# Patient Record
Sex: Female | Born: 1975 | Race: Black or African American | Hispanic: No | Smoking: Current every day smoker
Health system: Southern US, Community
[De-identification: ages and names within clinical notes are randomized; demographics above are authoritative.]

## PROBLEM LIST (undated history)

## (undated) ENCOUNTER — Emergency Department (HOSPITAL_COMMUNITY): Admission: EM | Payer: Non-veteran care | Source: Home / Self Care

## (undated) DIAGNOSIS — O99111 Other diseases of the blood and blood-forming organs and certain disorders involving the immune mechanism complicating pregnancy, first trimester: Secondary | ICD-10-CM

## (undated) DIAGNOSIS — D6862 Lupus anticoagulant syndrome: Secondary | ICD-10-CM

## (undated) HISTORY — PX: CHOLECYSTECTOMY: SHX55

## (undated) HISTORY — DX: Other diseases of the blood and blood-forming organs and certain disorders involving the immune mechanism complicating pregnancy, first trimester: D68.62

## (undated) HISTORY — DX: Other diseases of the blood and blood-forming organs and certain disorders involving the immune mechanism complicating pregnancy, first trimester: O99.111

---

## 2000-11-26 ENCOUNTER — Other Ambulatory Visit: Admission: RE | Admit: 2000-11-26 | Discharge: 2000-11-26 | Payer: Self-pay | Admitting: Internal Medicine

## 2004-10-13 ENCOUNTER — Inpatient Hospital Stay (HOSPITAL_COMMUNITY): Admission: AD | Admit: 2004-10-13 | Discharge: 2004-10-21 | Payer: Self-pay | Admitting: Obstetrics and Gynecology

## 2004-10-13 ENCOUNTER — Ambulatory Visit (HOSPITAL_COMMUNITY): Admission: RE | Admit: 2004-10-13 | Discharge: 2004-10-13 | Payer: Self-pay | Admitting: Obstetrics and Gynecology

## 2004-10-18 ENCOUNTER — Encounter (INDEPENDENT_AMBULATORY_CARE_PROVIDER_SITE_OTHER): Payer: Self-pay | Admitting: General Surgery

## 2005-01-04 ENCOUNTER — Other Ambulatory Visit: Admission: RE | Admit: 2005-01-04 | Discharge: 2005-01-04 | Payer: Self-pay | Admitting: Obstetrics and Gynecology

## 2005-02-18 ENCOUNTER — Inpatient Hospital Stay (HOSPITAL_COMMUNITY): Admission: AD | Admit: 2005-02-18 | Discharge: 2005-02-20 | Payer: Self-pay | Admitting: Obstetrics and Gynecology

## 2006-12-09 ENCOUNTER — Emergency Department (HOSPITAL_COMMUNITY): Admission: EM | Admit: 2006-12-09 | Discharge: 2006-12-09 | Payer: Self-pay | Admitting: Emergency Medicine

## 2008-01-08 ENCOUNTER — Emergency Department (HOSPITAL_COMMUNITY): Admission: EM | Admit: 2008-01-08 | Discharge: 2008-01-09 | Payer: Self-pay | Admitting: Emergency Medicine

## 2008-01-18 ENCOUNTER — Emergency Department (HOSPITAL_COMMUNITY): Admission: EM | Admit: 2008-01-18 | Discharge: 2008-01-18 | Payer: Self-pay | Admitting: Emergency Medicine

## 2008-01-26 ENCOUNTER — Emergency Department (HOSPITAL_COMMUNITY): Admission: EM | Admit: 2008-01-26 | Discharge: 2008-01-26 | Payer: Self-pay | Admitting: Emergency Medicine

## 2008-01-26 ENCOUNTER — Encounter: Payer: Self-pay | Admitting: Orthopedic Surgery

## 2008-01-28 ENCOUNTER — Ambulatory Visit: Payer: Self-pay | Admitting: Orthopedic Surgery

## 2008-01-28 DIAGNOSIS — S93409A Sprain of unspecified ligament of unspecified ankle, initial encounter: Secondary | ICD-10-CM | POA: Insufficient documentation

## 2008-02-11 ENCOUNTER — Ambulatory Visit: Payer: Self-pay | Admitting: Orthopedic Surgery

## 2008-11-24 ENCOUNTER — Emergency Department (HOSPITAL_COMMUNITY): Admission: EM | Admit: 2008-11-24 | Discharge: 2008-11-24 | Payer: Self-pay | Admitting: Emergency Medicine

## 2009-03-23 ENCOUNTER — Emergency Department (HOSPITAL_COMMUNITY): Admission: EM | Admit: 2009-03-23 | Discharge: 2009-03-23 | Payer: Self-pay | Admitting: Emergency Medicine

## 2009-10-03 ENCOUNTER — Emergency Department (HOSPITAL_COMMUNITY): Admission: EM | Admit: 2009-10-03 | Discharge: 2009-10-03 | Payer: Self-pay | Admitting: Emergency Medicine

## 2009-11-30 ENCOUNTER — Emergency Department (HOSPITAL_COMMUNITY): Admission: EM | Admit: 2009-11-30 | Discharge: 2009-11-30 | Payer: Self-pay | Admitting: Emergency Medicine

## 2010-05-21 LAB — DIFFERENTIAL
Lymphocytes Relative: 46 % (ref 12–46)
Lymphs Abs: 1.4 10*3/uL (ref 0.7–4.0)
Monocytes Absolute: 0.3 10*3/uL (ref 0.1–1.0)
Monocytes Relative: 11 % (ref 3–12)
Neutro Abs: 1.2 10*3/uL — ABNORMAL LOW (ref 1.7–7.7)
Neutrophils Relative %: 40 % — ABNORMAL LOW (ref 43–77)

## 2010-05-21 LAB — URINALYSIS, ROUTINE W REFLEX MICROSCOPIC
Bilirubin Urine: NEGATIVE
Nitrite: NEGATIVE
Specific Gravity, Urine: 1.015 (ref 1.005–1.030)
Urobilinogen, UA: 0.2 mg/dL (ref 0.0–1.0)

## 2010-05-21 LAB — COMPREHENSIVE METABOLIC PANEL
ALT: 20 U/L (ref 0–35)
Albumin: 4.3 g/dL (ref 3.5–5.2)
CO2: 31 mEq/L (ref 19–32)
Chloride: 102 mEq/L (ref 96–112)
Creatinine, Ser: 0.85 mg/dL (ref 0.4–1.2)
GFR calc non Af Amer: >60 mL/min (ref >60)
Glucose, Bld: 89 mg/dL (ref 70–99)
Potassium: 4.4 mEq/L (ref 3.5–5.1)
Sodium: 141 mEq/L (ref 135–145)

## 2010-05-21 LAB — CBC
HCT: 39.3 % (ref 36.0–46.0)
MCHC: 33.8 g/dL (ref 30.0–36.0)
MCV: 93.5 fL (ref 78.0–100.0)
RDW: 13.2 % (ref 11.5–15.5)

## 2010-05-21 LAB — URINE MICROSCOPIC-ADD ON

## 2010-06-09 LAB — URINALYSIS, ROUTINE W REFLEX MICROSCOPIC
Bilirubin Urine: NEGATIVE
Leukocytes, UA: NEGATIVE
Nitrite: NEGATIVE
Protein, ur: NEGATIVE mg/dL

## 2010-06-09 LAB — COMPREHENSIVE METABOLIC PANEL
ALT: 11 U/L (ref 0–35)
AST: 18 U/L (ref 0–37)
Albumin: 4.2 g/dL (ref 3.5–5.2)
Alkaline Phosphatase: 46 U/L (ref 39–117)
Calcium: 9.1 mg/dL (ref 8.4–10.5)
GFR calc Af Amer: >60 mL/min (ref >60)
Glucose, Bld: 95 mg/dL (ref 70–99)
Sodium: 139 mEq/L (ref 135–145)
Total Bilirubin: 0.6 mg/dL (ref 0.3–1.2)
Total Protein: 7.4 g/dL (ref 6.0–8.3)

## 2010-06-09 LAB — CBC
MCHC: 34.8 g/dL (ref 30.0–36.0)
MCV: 94.2 fL (ref 78.0–100.0)
Platelets: 229 10*3/uL (ref 150–400)
RBC: 3.94 MIL/uL (ref 3.87–5.11)
WBC: 8.8 10*3/uL (ref 4.0–10.5)

## 2010-06-09 LAB — DIFFERENTIAL
Basophils Absolute: 0 10*3/uL (ref 0.0–0.1)
Basophils Relative: 0 % (ref 0–1)

## 2010-06-09 LAB — PREGNANCY, URINE: Preg Test, Ur: NEGATIVE

## 2010-06-09 LAB — WET PREP, GENITAL
Clue Cells Wet Prep HPF POC: NONE SEEN
Trich, Wet Prep: NONE SEEN
Yeast Wet Prep HPF POC: NONE SEEN

## 2010-06-09 LAB — URINE MICROSCOPIC-ADD ON

## 2010-07-21 NOTE — H&P (Signed)
Caroline Bishop, Caroline Bishop           ACCOUNT NO.:  [id number]   MEDICAL RECORD NO.:  [id number]          PATIENT TYPE:  INP   LOCATION:  9146                          FACILITY:  [facility]   PHYSICIAN:  Naima A. Dillard, M.D. DATE OF BIRTH:  04-14-1975   DATE OF ADMISSION:  02/18/2005  DATE OF DISCHARGE:                                HISTORY & PHYSICAL   CONTINUATION   REVIEW OF SYSTEMS:  Is as described above. The patient is typical of one  with a uterine pregnancy at term in early labor.   PHYSICAL EXAM:  VITAL SIGNS:  Stable. The patient is afebrile.  HEENT is unremarkable.  HEART:  Regular rate and rhythm.  LUNGS:  Clear.  ABDOMEN:  Gravid in its contour. Uterine fundus is noted to extend 39 cm  above the level of the pubic symphysis. Leopold's maneuvers finds the infant  to be in a longitudinal lie, cephalic presentation and the estimated fetal  weight is 7-1/2 pounds. The baseline of the fetal heart rate monitor is 140  with average long-term variability. Reactivity is present with no periodic  changes. On admission the patient was contracting irregularly. Following  morphine rest she began contracting every 2-3 minutes.  PELVIC:  Her cervix was 3 cm dilated, 80% effaced, vertex came down to -3  station. On admission Dr. Normand Sloop artificially ruptured membranes with clear  fluid and inserted FSE and IUPC.  EXTREMITIES:  Show no pathologic edema. DTRs were 1+ with no clonus.   ASSESSMENT:  Intrauterine pregnancy at term, early labor.   PLAN:  Admit per Dr. Normand Sloop. Routine M.D. orders. See orders as follows.      Rica Koyanagi, C.N.M.      Naima A. Normand Sloop, M.D.  Electronically Signed    SDM/MEDQ  D:  02/18/2005  T:  02/19/2005  Job:  [id number]
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
## 2010-07-21 NOTE — Op Note (Signed)
NAME:  Caroline Bishop, Caroline Bishop NO.:  [id number]   MEDICAL RECORD NO.:  [id number]          PATIENT TYPE:  INP   LOCATION:  A427                          FACILITY:  [facility]   PHYSICIAN:  Dalia Heading, M.D.  DATE OF BIRTH:  March 04, 1976   DATE OF PROCEDURE:  10/18/2004  DATE OF DISCHARGE:                                 OPERATIVE REPORT   PREOPERATIVE DIAGNOSIS:  Biliary colic, cholecystitis, cholelithiasis,  intrauterine pregnancy.   POSTOPERATIVE DIAGNOSIS:  Biliary colic, cholecystitis, cholelithiasis,  intrauterine pregnancy.   PROCEDURE:  Open cholecystectomy.   SURGEON:  Dr. Franky Macho.   ASSISTANT:  Dr. Arna Snipe.   ANESTHESIA:  General endotracheal.   INDICATIONS:  The patient is a 35 year old black female who is approximately  [redacted] weeks gestation, intrauterine pregnancy, who presents with biliary colic  and sludge. Despite conservative management, she continues to have  significant reflux disease and right upper quadrant abdominal pain. This is  new compared to her previous pregnancy. The patient thus comes to the  operating for an open cholecystectomy. Risks and benefits of the procedure  including bleeding, infection, hepatobiliary injury, and the possibility of  miscarriage were fully explained to the patient, who gave informed consent.   PROCEDURE NOTE:  The patient was placed in the reversed Trendelenburg  position after induction of general endotracheal anesthesia. The patient was  tilted to the left in order to free the uterus off the inferior vena cava.  Good fetal heart tones were heard prior to surgery. The abdomen was prepped  and draped using the usual sterile technique with Betadine. Surgical site  confirmation was performed.   A right subcostal incision was made down to the internal oblique  aponeurosis. Muscle-splitting incisions were then made after the fascia was  incised to the transversalis fascia. The peritoneal cavity was entered  into  without difficulty. The packing was then placed around the fundus of the  uterus as well as the bowel around the gallbladder. The fundus was noted to  be high riding. The gallbladder was then grasped and freed away from liver  in a retrograde domed-down fashion using Bovie electrocautery and blunt  dissection. The cystic artery was first identified. Its juncture to the  infundibulum fully identified. Clips were placed proximally and distally on  cystic artery, and the cystic artery was divided. This was likewise done to  the cystic duct. The gallbladder was then removed from the operative field.  Any bleeding was controlled using Bovie electrocautery. Surgicel was placed  in the gallbladder fossa. No bile leakage was noted at the end of the  procedure. The fundus of the uterus was inspected, and no evidence of injury  was noted. The transversalis fascia was closed using a running 0 Vicryl  suture. The anterior rectus sheath and internal oblique aponeurosis were  reapproximated using interrupted 0 Vicryl sutures. Subcutaneous layer was  reapproximated using 3-0 Vicryl interrupted sutures. The skin was closed  using staples. Betadine ointment and dry sterile dressing were applied.   All tape and needle counts were correct at the end of the procedure. The  patient was extubated  in the operating room and went back to recovery room  awake in stable condition. A fetal heart check will be performed at that  time.   COMPLICATIONS:  None.   SPECIMEN:  Gallbladder.   BLOOD LOSS:  Minimal.      Dalia Heading, M.D.  Electronically Signed     MAJ/MEDQ  D:  10/18/2004  T:  10/18/2004  Job:  [id number]

## 2010-07-21 NOTE — H&P (Signed)
Caroline Bishop, Caroline Bishop           ACCOUNT NO.:  [id number]   MEDICAL RECORD NO.:  [id number]          PATIENT TYPE:  INP   LOCATION:  A427                          FACILITY:  [facility]   PHYSICIAN:  Tilda Burrow, M.D. DATE OF BIRTH:  04/04/1975   DATE OF ADMISSION:  10/13/2004  DATE OF DISCHARGE:  LH                                HISTORY & PHYSICAL   ADMITTING DIAGNOSES:  1.  Pregnancy [redacted] weeks gestation.  2.  Right upper quadrant pain.  3.  Cholecystitis.  4.  Cholelithiasis.   HISTORY OF PRESENT ILLNESS:  This 35 year old female gravida 5, para 2, AB 2  due February 12, 2005 by ultrasound criteria placing her approximately [redacted]  weeks gestation is admitted for antibiotic initiation and surgical consult  regarding right upper quadrant pain.  She was seen in our office on October 13, 2004 where right upper quadrant pain with a positive Murphy's sign was  noted.  Patient was complaining of nausea and indeed has vomited once since  admission.  She was referred for ultrasound which reveals a normal-sized  gallbladder with no thickening of the walls but there is extensive sludge  within the gallbladder plus small floating densities consistent with some  tiny gallstones.  The common duct is normal in diameter at 4 mm.  There is  tenderness over the right upper quadrant.  Patient is admitted for  antibiotic therapy and surgical consultation regarding gallstones.   PAST MEDICAL HISTORY:  Benign.   SURGICAL HISTORY:  Cesarean section in the past.   ALLERGIES:  None known.   FAMILY HISTORY:  Positive for hypertension and diabetes.   SOCIAL HISTORY:  Employed.  Works at [employer].   PHYSICAL EXAMINATION:  GENERAL:  Height 5 feet 6 inches.  Weight 149.  Blood  pressure 110/64.  HEENT:  Pupils equal, round, reactive.  Extraocular movements intact.  Neck  supple.  Trachea midline.  CHEST:  Clear to auscultation.  ABDOMEN:  Gravid uterus just above the umbilicus, with right upper  quadrant  pain.  EXTERNAL GENITALIA:  Normal.  PELVIC:  Exam deferred.   LABORATORIES:  Blood type O positive, hemoglobin 11.8, hematocrit 36.3 early  in the pregnancy.  Admission labs see laboratory reports.   PLAN:  Admit.  Clear liquids today.  Surgical consult Dr. Malvin Johns, probable  open cholecystectomy this weekend.       JVF/MEDQ  D:  10/14/2004  T:  10/14/2004  Job:  [id number]

## 2010-07-21 NOTE — Consult Note (Signed)
Caroline Bishop, Caroline Bishop NO.:  [id number]   MEDICAL RECORD NO.:  [id number]          PATIENT TYPE:  INP   LOCATION:  A427                          FACILITY:  [facility]   PHYSICIAN:  Barbaraann Barthel, M.D. DATE OF BIRTH:  [birth year]   DATE OF CONSULTATION:  10/14/2004  DATE OF DISCHARGE:                                   CONSULTATION   Surgery was asked to see this 35 year old black female to evaluate for  surgery for gallbladder disease.   This patient was referred by the OB/GYN service, she is 22-weeks pregnant.   CHIEF COMPLAINT:  Nausea and vomiting.   HISTORY OF PRESENT MEDICAL ILLNESS:  The patient states that she has had  during her pregnancy almost daily occasions of nausea. Yesterday she had her  first bout with vomiting. When she vomited at that time she was not having  any particular abdominal pain and no right upper quadrant pain. She was  admitted to the hospital, sonogram was performed. There was some sludge  noted in the gallbladder, the gallbladder otherwise appeared to be  completely normal. There is a question of possibly some floating stones,  very small, but not definitely seen in the gallbladder's contour, is not  thickened or any way suggesting with fluid or any dilated biliary radicles  or any thickness the wall to suggest any changes of acute cholecystitis in  nature. Also liver function studies and amylase and lipase are not elevated.   Surgery nevertheless was asked to see to evaluate.   PHYSICAL EXAMINATION:  GENERAL:  Discloses a very pleasant 35 year old black  female who is 5 feet, 6 inches, weighs 149 pounds, and is 22-weeks pregnant.  VITAL SIGNS:  She is afebrile, her temperature is 98.2, blood pressure is  109/60 and her heart rate is 88, respirations are 18. It should be noted  that she had recent fetal heart rate being in the 140's.  HEENT:  Head is normocephalic. Eyes - extraocular movements are intact.  Pupils are round and react  to light and accommodation. There is some  conjunctival pallor noted bilaterally and she also has some bruising below  her left eye which she states occurred when she fell out of her bed and hit  her dresser.  CHEST:  Clear both to anterior and posterior auscultation.  HEART:  Regular rhythm.  BREASTS:  Enlarged with changes physiologic for pregnancy.  ABDOMEN:  Consistent with 22-week gestation without any tenderness or  guarding noted in the right upper quadrant. No femoral or inguinal hernias  are appreciated.  RECTAL EXAMINATION:  Was not performed.  PELVIC EXAMINATION:  Was not performed.  EXTREMITIES:  Otherwise within normal limits, there is no clubbing, cyanosis  or edema.   REVIEW OF SYSTEMS:  GI:  No past history of hepatitis, no previous symptoms  suggesting anything other than indigestion or GERD type symptoms perfectly  consistent with acid reflux encountered in pregnancy. No changes in bowel  habits, no problems with hemorrhoids during this pregnancy. No unexplained  weight loss. ENDOCRINE:  No history of history of diabetes or thyroid  disease.  CARDIORESPIRATORY:  The  patient is a non smoker, however she  states that she occasionally has a cigarette and occasionally has a glass of  champagne with her fiance. MUSCULOSKELETAL:  Within normal limits. GU:  No  history of dysuria or nephrolithiasis.   OB/GYN HISTORY:  The patient is 22-weeks pregnant. She is a gravida 3, para  1, cesarean section 1, AB-0,  female. No past history of carcinoma of the breast.   LABORATORY DATA:  The patient has a white count of 12.7 thousand with an H&H  of 10.9 and 31.5, 232,000 platelets, 76 neutrophils noted. No increase in  her liver function studies, completely within normal limits, no elevation at  all. Amylase and lipase are within normal limits. Sonogram results as  mentioned above.   IMPRESSION:  I am not convinced that this patient's symptoms at all are  related to gallbladder  disease or that in fact she does have stones which  are an equivocal finding on this sonogram. She certainly has no sonographic  changes of acute cholecystitis and her liver function studies and her  clinical exam to me are normal. I cannot say for sure that she will not  develop any problems with her gallbladder during this pregnancy. However I  certainly do not feel that this is occurring at present or has occurred at  this time.   PLAN:  I would try to have her avoid a greasy diet, avoid chocolate, etc.,  and the usual well known by the OB/GYN service as their prevention for  gastric reflux and related to pregnancy.   I will follow this patient tomorrow as well. I wanted to see her in case I  thought that surgery would be necessary. I really do not feel that is the  case. You may wish to acquire a second surgical opinion as I will not be  able to follow this patient beyond Monday. However as stated previously, if  I felt surgery was needed we would proceed with an open cholecystectomy  tomorrow. As stated, I do not feel that she will require surgery and I think  it is highly likely that she will be able to complete her pregnancy without  any problems in this regard.   I will discuss this further with Dr. Emelda Fear of the OB/GYN service.      Barbaraann Barthel, M.D.  Electronically Signed     WB/MEDQ  D:  10/14/2004  T:  10/14/2004  Job:  [id number]

## 2010-07-21 NOTE — Discharge Summary (Signed)
NAME:  Caroline Bishop, SHOUN NO.:  [id number]   MEDICAL RECORD NO.:  [id number]          PATIENT TYPE:  INP   LOCATION:  A427                          FACILITY:  [facility]   PHYSICIAN:  Dalia Heading, M.D.  DATE OF BIRTH:  [birth year]   DATE OF ADMISSION:  10/13/2004  DATE OF DISCHARGE:  08/19/2006LH                                 DISCHARGE SUMMARY   HOSPITAL COURSE:  The patient is a 35 year old white female who is  approximately 67 weeks' gestation who presented with persistent nausea and  vomiting.  She also had some right upper quadrant abdominal pain.  Ultrasound of the gallbladder revealed sludge.  Despite conservative  management, she continued to have symptoms of biliary colic.  A surgery  consultation was obtained, and it was elected to proceed with an open  cholecystectomy.  This was performed on October 18, 2004.  She tolerated the  procedure well.  Postoperative course was remarkable for fevers which did  resolve.  On the day of discharge, her white blood cell count was within  normal limits with no bands.  Her diet was advanced without difficulty once  she can tolerate oral intake.  Her incisions healed well.   The patient is being discharged home on October 21, 2004 and improving  condition.   DISCHARGE INSTRUCTIONS:  The patient is to follow up Dr. Franky Macho on  October 26, 2004, Dr. Emelda Fear as scheduled.   DISCHARGE MEDICATIONS:  1.  Vicodin 1-2 tablets p.o. q.4 h p.r.n. pain.  2.  She was resume all of her other medications as previously prescribed.   PRINCIPAL DIAGNOSES:  1.  Biliary colic, cholecystitis.  2.  Intrauterine pregnancy at 22 weeks' gestation.   PRINCIPAL PROCEDURE:  Open cholecystectomy on October 18, 2004.      Dalia Heading, M.D.  Electronically Signed     MAJ/MEDQ  D:  10/21/2004  T:  10/21/2004  Job:  [id number]   cc:   Tilda Burrow, M.D.  Fax: [phone]

## 2011-09-26 ENCOUNTER — Encounter (HOSPITAL_COMMUNITY): Payer: Self-pay | Admitting: *Deleted

## 2011-09-26 ENCOUNTER — Emergency Department (HOSPITAL_COMMUNITY): Payer: Self-pay

## 2011-09-26 ENCOUNTER — Emergency Department (HOSPITAL_COMMUNITY)
Admission: EM | Admit: 2011-09-26 | Discharge: 2011-09-26 | Disposition: A | Discharge status: Home / Self Care | Payer: Self-pay | Attending: Emergency Medicine | Admitting: Emergency Medicine

## 2011-09-26 DIAGNOSIS — R51 Headache: Secondary | ICD-10-CM | POA: Insufficient documentation

## 2011-09-26 DIAGNOSIS — F17200 Nicotine dependence, unspecified, uncomplicated: Secondary | ICD-10-CM | POA: Insufficient documentation

## 2011-09-26 DIAGNOSIS — S0001XA Abrasion of scalp, initial encounter: Secondary | ICD-10-CM

## 2011-09-26 DIAGNOSIS — M542 Cervicalgia: Secondary | ICD-10-CM | POA: Insufficient documentation

## 2011-09-26 DIAGNOSIS — W108XXA Fall (on) (from) other stairs and steps, initial encounter: Secondary | ICD-10-CM | POA: Insufficient documentation

## 2011-09-26 DIAGNOSIS — S82409A Unspecified fracture of shaft of unspecified fibula, initial encounter for closed fracture: Secondary | ICD-10-CM | POA: Insufficient documentation

## 2011-09-26 DIAGNOSIS — IMO0002 Reserved for concepts with insufficient information to code with codable children: Secondary | ICD-10-CM | POA: Insufficient documentation

## 2011-09-26 DIAGNOSIS — S82401A Unspecified fracture of shaft of right fibula, initial encounter for closed fracture: Secondary | ICD-10-CM

## 2011-09-26 DIAGNOSIS — Z23 Encounter for immunization: Secondary | ICD-10-CM | POA: Insufficient documentation

## 2011-09-26 DIAGNOSIS — Z9089 Acquired absence of other organs: Secondary | ICD-10-CM | POA: Insufficient documentation

## 2011-09-26 DIAGNOSIS — S50312A Abrasion of left elbow, initial encounter: Secondary | ICD-10-CM

## 2011-09-26 MED ORDER — HYDROMORPHONE HCL PF 1 MG/ML IJ SOLN
1.0000 mg | Freq: Once | INTRAMUSCULAR | Status: AC
  Administered 2011-09-26: 1 mg via INTRAMUSCULAR
  Filled 2011-09-26: qty 1

## 2011-09-26 MED ORDER — OXYCODONE-ACETAMINOPHEN 5-325 MG PO TABS
1.0000 | ORAL_TABLET | Freq: Once | ORAL | Status: AC
  Administered 2011-09-26: 1 via ORAL
  Filled 2011-09-26: qty 1

## 2011-09-26 MED ORDER — NAPROXEN 250 MG PO TABS: 250.0000 mg | ORAL_TABLET | Freq: Two times a day (BID) | ORAL | Status: DC

## 2011-09-26 MED ORDER — OXYCODONE-ACETAMINOPHEN 5-325 MG PO TABS: ORAL_TABLET | ORAL | Status: DC

## 2011-09-26 MED ORDER — TETANUS-DIPHTH-ACELL PERTUSSIS 5-2.5-18.5 LF-MCG/0.5 IM SUSP
0.5000 mL | Freq: Once | INTRAMUSCULAR | Status: AC
  Administered 2011-09-26: 0.5 mL via INTRAMUSCULAR
  Filled 2011-09-26: qty 0.5

## 2011-09-26 NOTE — ED Notes (Signed)
Fell down several steps last night,  Struck head,  Pain rt ankle.  ? Loc  c  Collar applied.  Abrasions to lt  Elbow.  ? Lac to lt post scalp, dried blood present.

## 2011-09-26 NOTE — ED Provider Notes (Signed)
History     CSN: [id number]  Arrival date & time 09/26/11  1107   First MD Initiated Contact with Patient 09/26/11 1230      Chief Complaint  Patient presents with  . Fall     HPI Pt was seen at 1245.  Per pt and significant other, c/o sudden onset and resolution of one episode of slip and fall down several steps last night.  Pt's significant other states he tried to catch her as she fell.  Denies LOC, no syncope, no AMS since fall, no prodromal symptoms before fall.  Denies CP/SOB, no abd pain, no back pain, no visual changes, no focal motor weakness, no tingling/numbness in extremities.  Pt c/o "cut" on her head, abrasions on her left elbow and right ankle pain.     Unk last Td Ortho:  Dr. Romeo Apple (seen previously) History reviewed. No pertinent past medical history.  Past Surgical History  Procedure Date  . Cholecystectomy   . Cesarean section     History  Substance Use Topics  . Smoking status: Current Everyday Smoker  . Smokeless tobacco: Not on file  . Alcohol Use: Yes    Review of Systems ROS: Statement: All systems negative except as marked or noted in the HPI; Constitutional: Negative for fever and chills. ; ; Eyes: Negative for eye pain, redness and discharge. ; ; ENMT: Negative for ear pain, hoarseness, nasal congestion, sinus pressure and sore throat. ; ; Cardiovascular: Negative for chest pain, palpitations, diaphoresis, dyspnea and peripheral edema. ; ; Respiratory: Negative for cough, wheezing and stridor. ; ; Gastrointestinal: Negative for nausea, vomiting, diarrhea, abdominal pain, blood in stool, hematemesis, jaundice and rectal bleeding. . ; ; Genitourinary: Negative for dysuria, flank pain and hematuria. ; ; Musculoskeletal: +ankle pain. Negative for back pain and neck pain. Negative for deformity.; ; Skin: +abrasions. Negative for pruritus, rash, blisters, bruising and skin lesion.; ; Neuro: Negative for headache, lightheadedness and neck stiffness. Negative  for weakness, altered level of consciousness , altered mental status, extremity weakness, paresthesias, involuntary movement, seizure and syncope.     Allergies  Review of patient's allergies indicates no known allergies.  Home Medications   Current Outpatient Rx  Name Route Sig Dispense Refill  . DIPHENHYDRAMINE-APAP (SLEEP) 25-500 MG PO TABS Oral Take 2 tablets by mouth at bedtime as needed. pain    . IBUPROFEN 200 MG PO TABS Oral Take 800 mg by mouth every 8 (eight) hours as needed. pain      Pulse 85  Temp 98.3 F (36.8 C) (Oral)  Resp 22  Ht 5\' 6"  (1.676 m)  Wt 150 lb (68.04 kg)  BMI 24.21 kg/m2  SpO2 99%  LMP 09/22/2011  Physical Exam Physical examination: Vital signs and O2 SAT: Reviewed; Constitutional: Well developed, Well nourished, Well hydrated, In no acute distress; Head and Face: Normocephalic, +very small hemostatic superficial abrasion to right parietal scalp.; Eyes: EOMI, PERRL, No scleral icterus; ENMT: Mouth and pharynx normal, Left TM normal, Right TM normal, Mucous membranes moist; Neck: Immobilized in C-collar, Trachea midline; Spine: No midline CS, TS, LS tenderness.; Cardiovascular: Regular rate and rhythm, No gallop; Respiratory: Breath sounds clear & equal bilaterally, No wheezes, Normal respiratory effort/excursion; Chest: Nontender, No deformity, Movement normal, No crepitus, No abrasions or ecchymosis.; Abdomen: Soft, Nontender, Nondistended, Normal bowel sounds, No abrasions or ecchymosis.; Genitourinary: No CVA tenderness ; Extremities: Full range of motion major/large joints of bilat UE's and LE's without pain, edema, deformity, or tenderness to palp, with exception  of right ankle. +tender to palp right lateral maleolar area w/localized edema, NMS intact right foot, strong pedal pp, LE muscle compartments soft.  No right proximal fibular head tenderness, no knee tenderness, no foot tenderness.  No deformity, no ecchymosis, no erythema, no open wounds.   +plantarflexion of right foot w/calf squeeze.  No palpable gap right Achilles's tendon.  Decreased ROM F/E d/t pain. Neurovascularly intact, Pulses normal, No deformity, Pelvis stable; Neuro: AA&Ox3, GCS 15.  Major CN grossly intact. Speech clear. No gross focal motor or sensory deficits in extremities.; Skin: Color normal, Warm, Dry, +very superficial hemostatic abrasions left elbow.   ED Course  Procedures   MDM  MDM Reviewed: nursing note and vitals Interpretation: x-ray and CT scan     Dg Ankle Complete Right 09/26/2011  *RADIOLOGY REPORT*  Clinical Data: Fall  RIGHT ANKLE - COMPLETE 3+ VIEW  Comparison: None.  Findings: Oblique, nondisplaced intra-articular fracture of the distal fibula is identified. No additional fractures or subluxations identified.  There is lateral soft tissue swelling.  IMPRESSION:  1.  Nondisplaced distal fibular fracture.  Original Report Authenticated By: Rosealee Albee, M.D.   Ct Head Wo Contrast 09/26/2011  *RADIOLOGY REPORT*  Clinical Data:  Fall.  Trauma to the head and neck.  Pain and bleeding.  CT HEAD WITHOUT CONTRAST CT CERVICAL SPINE WITHOUT CONTRAST  Technique:  Multidetector CT imaging of the head and cervical spine was performed following the standard protocol without intravenous contrast.  Multiplanar CT image reconstructions of the cervical spine were also generated.  Comparison:  01/08/2008  CT HEAD  Findings: The brain has a normal appearance without evidence of malformation, atrophy, old or acute infarction, mass lesion, hemorrhage, hydrocephalus or extra-axial collection.  No skull fracture.  No fluid in the sinuses, middle ears or mastoids.  IMPRESSION: Normal head CT  CT CERVICAL SPINE  Findings: Alignment is normal.  No fracture.  No soft tissue swelling.  No degenerative change or other focal lesion.  IMPRESSION: Normal C-spine CT  Original Report Authenticated By: Thomasenia Sales, M.D.   Ct Cervical Spine Wo Contrast 09/26/2011  *RADIOLOGY  REPORT*  Clinical Data:  Fall.  Trauma to the head and neck.  Pain and bleeding.  CT HEAD WITHOUT CONTRAST CT CERVICAL SPINE WITHOUT CONTRAST  Technique:  Multidetector CT imaging of the head and cervical spine was performed following the standard protocol without intravenous contrast.  Multiplanar CT image reconstructions of the cervical spine were also generated.  Comparison:  01/08/2008  CT HEAD  Findings: The brain has a normal appearance without evidence of malformation, atrophy, old or acute infarction, mass lesion, hemorrhage, hydrocephalus or extra-axial collection.  No skull fracture.  No fluid in the sinuses, middle ears or mastoids.  IMPRESSION: Normal head CT  CT CERVICAL SPINE  Findings: Alignment is normal.  No fracture.  No soft tissue swelling.  No degenerative change or other focal lesion.  IMPRESSION: Normal C-spine CT  Original Report Authenticated By: Thomasenia Sales, M.D.     3:26 PM:   No central CS tenderness to palp, FROM CS without midline tenderness; c-collar removed.  Requesting another dose of pain meds and wants to go home now.  Will splint right ankle, give crutches, rx pain meds, f/u Ortho MD.  Dx testing d/w pt and family.  Questions answered.  Verb understanding, agreeable to d/c home with outpt f/u.    The patient appears reasonably screened and/or stabilized for discharge and I doubt any other medical condition  or other [facility] requiring further screening, evaluation, or treatment in the ED at this time prior to discharge.   Laray Anger, DO 09/26/11 2016

## 2011-09-26 NOTE — ED Notes (Signed)
Pt reports mild relief from pain meds, requesting more for relief.  edp notified.

## 2011-09-27 ENCOUNTER — Emergency Department (HOSPITAL_COMMUNITY)
Admission: EM | Admit: 2011-09-27 | Discharge: 2011-09-27 | Disposition: A | Discharge status: Home / Self Care | Payer: Self-pay | Attending: Orthopaedic Surgery | Admitting: Orthopaedic Surgery

## 2011-09-27 ENCOUNTER — Encounter (HOSPITAL_COMMUNITY): Payer: Self-pay | Admitting: Emergency Medicine

## 2011-09-27 DIAGNOSIS — Z09 Encounter for follow-up examination after completed treatment for conditions other than malignant neoplasm: Secondary | ICD-10-CM | POA: Insufficient documentation

## 2011-09-27 DIAGNOSIS — S8261XA Displaced fracture of lateral malleolus of right fibula, initial encounter for closed fracture: Secondary | ICD-10-CM

## 2011-09-27 DIAGNOSIS — F17200 Nicotine dependence, unspecified, uncomplicated: Secondary | ICD-10-CM | POA: Insufficient documentation

## 2011-09-27 MED ORDER — HYDROCODONE-ACETAMINOPHEN 5-325 MG PO TABS
1.5000 | ORAL_TABLET | Freq: Once | ORAL | Status: AC | PRN
  Administered 2011-09-27: 1.5 via ORAL
  Filled 2011-09-27: qty 2

## 2011-09-27 MED ORDER — HYDROCODONE-ACETAMINOPHEN 7.5-325 MG PO TABS: 1.0000 | ORAL_TABLET | ORAL | Status: AC | PRN

## 2011-09-27 MED ORDER — HYDROCODONE-ACETAMINOPHEN 7.5-325 MG PO TABS: 1.0000 | ORAL_TABLET | Freq: Four times a day (QID) | ORAL | Status: DC | PRN

## 2011-09-27 NOTE — ED Provider Notes (Signed)
History  This chart was scribed for Donnetta Hutching, MD by Erskine Emery. This patient was seen in room APA03/APA03 and the patient's care was started at 9:49.   CSN: [id number]  Arrival date & time 09/27/11  0919   First MD Initiated Contact with Patient 09/27/11 [phone]      Chief Complaint  Patient presents with  . Follow-up    (Consider location/radiation/quality/duration/timing/severity/associated sxs/prior treatment) HPI Caroline Bishop is a 36 y.o. female who presents to the Emergency Department complaining of a right ankle injury with associated numbness and pain in right toes. Pt reports she fell down the stairs two nights ago, came to the ED yesterday and was told she broke her ankle in 2 places. Pt was told to follow up with Dr. Mort Sawyers office who sent her to to Dr. Sanjuan Dame office who sent her here.  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   History reviewed. No pertinent past medical history.  Past Surgical History  Procedure Date  . Cholecystectomy   . Cesarean section     No family history on file.  History  Substance Use Topics  . Smoking status: Current Everyday Smoker -- 0.2 packs/day  . Smokeless tobacco: Not on file  . Alcohol Use: Yes    OB History    Grav Para Term Preterm Abortions TAB SAB Ect Mult Living                  Review of Systems  Constitutional: Negative for fever and chills.  HENT: Negative for neck pain.   Eyes: Negative for visual disturbance.  Respiratory: Negative for shortness of breath.   Cardiovascular: Negative for chest pain.  Gastrointestinal: Negative for nausea and vomiting.  Genitourinary: Negative for flank pain.  Musculoskeletal:       Fractured right ankle  Skin: Negative for rash.  Neurological: Negative for weakness.  Psychiatric/Behavioral: Negative for self-injury.    Allergies  Review of patient's allergies indicates no known allergies.  Home Medications   Current Outpatient Rx  Name  Route Sig Dispense Refill  . DIPHENHYDRAMINE-APAP (SLEEP) 25-500 MG PO TABS Oral Take 2 tablets by mouth at bedtime as needed. pain    . IBUPROFEN 200 MG PO TABS Oral Take 800 mg by mouth every 8 (eight) hours as needed. pain    . NAPROXEN 250 MG PO TABS Oral Take 1 tablet (250 mg total) by mouth 2 (two) times daily with a meal. 14 tablet 0  . OXYCODONE-ACETAMINOPHEN 5-325 MG PO TABS  1 or 2 tabs PO q6h prn pain 20 tablet 0    Triage Vitals: BP 115/87  Pulse 67  Temp 98.5 F (36.9 C) (Oral)  Resp 18  Ht 5\' 6"  (1.676 m)  Wt 150 lb (68.04 kg)  BMI 24.21 kg/m2  SpO2 100%  LMP 09/22/2011  Physical Exam  Constitutional: She is oriented to person, place, and time. She appears well-developed and well-nourished. No distress.  HENT:  Head: Normocephalic.  Eyes: Conjunctivae are normal.  Neck: No tracheal deviation present.  Cardiovascular:  No murmur heard. Musculoskeletal: Normal range of motion.       Right distal fibula fracture  Neurological: She is oriented to person, place, and time.  Skin: Skin is warm.  Psychiatric: She has a normal mood and affect. Her behavior is normal.    ED Course  Procedures (including critical care time) DIAGNOSTIC STUDIES: Oxygen Saturation is 100% on room air, normal by my interpretation.  COORDINATION OF CARE: 10:00-I evaluated the patient and informed her I'd take a look at her x-ray before determining a treatment plan.   Labs Reviewed - No data to display Dg Ankle Complete Right  09/26/2011  *RADIOLOGY REPORT*  Clinical Data: Fall  RIGHT ANKLE - COMPLETE 3+ VIEW  Comparison: None.  Findings: Oblique, nondisplaced intra-articular fracture of the distal fibula is identified. No additional fractures or subluxations identified.  There is lateral soft tissue swelling.  IMPRESSION:  1.  Nondisplaced distal fibular fracture.  Original Report Authenticated By: Rosealee Albee, M.D.   Ct Head Wo Contrast  09/26/2011  *RADIOLOGY REPORT*  Clinical  Data:  Fall.  Trauma to the head and neck.  Pain and bleeding.  CT HEAD WITHOUT CONTRAST CT CERVICAL SPINE WITHOUT CONTRAST  Technique:  Multidetector CT imaging of the head and cervical spine was performed following the standard protocol without intravenous contrast.  Multiplanar CT image reconstructions of the cervical spine were also generated.  Comparison:  01/08/2008  CT HEAD  Findings: The brain has a normal appearance without evidence of malformation, atrophy, old or acute infarction, mass lesion, hemorrhage, hydrocephalus or extra-axial collection.  No skull fracture.  No fluid in the sinuses, middle ears or mastoids.  IMPRESSION: Normal head CT  CT CERVICAL SPINE  Findings: Alignment is normal.  No fracture.  No soft tissue swelling.  No degenerative change or other focal lesion.  IMPRESSION: Normal C-spine CT  Original Report Authenticated By: Thomasenia Sales, M.D.   Ct Cervical Spine Wo Contrast  09/26/2011  *RADIOLOGY REPORT*  Clinical Data:  Fall.  Trauma to the head and neck.  Pain and bleeding.  CT HEAD WITHOUT CONTRAST CT CERVICAL SPINE WITHOUT CONTRAST  Technique:  Multidetector CT imaging of the head and cervical spine was performed following the standard protocol without intravenous contrast.  Multiplanar CT image reconstructions of the cervical spine were also generated.  Comparison:  01/08/2008  CT HEAD  Findings: The brain has a normal appearance without evidence of malformation, atrophy, old or acute infarction, mass lesion, hemorrhage, hydrocephalus or extra-axial collection.  No skull fracture.  No fluid in the sinuses, middle ears or mastoids.  IMPRESSION: Normal head CT  CT CERVICAL SPINE  Findings: Alignment is normal.  No fracture.  No soft tissue swelling.  No degenerative change or other focal lesion.  IMPRESSION: Normal C-spine CT  Original Report Authenticated By: Thomasenia Sales, M.D.     No diagnosis found.    MDM    X-ray reviewed from 09/26/2011.  Right ankle shows  nondisplaced distal fibular fracture. Consult to Dr. Hilda Lias orthopedics      I personally performed the services described in this documentation, which was scribed in my presence. The recorded information has been reviewed and considered.          Donnetta Hutching, MD 09/27/11 1055

## 2011-09-27 NOTE — Consult Note (Signed)
Reason for Consult:Fracture of the right lateral ankle Referring Physician: ER  Caroline Bishop is an 36 y.o. female.  HPI: She fell at home two days ago and hurt her left ankle.  She was seen here.  X-rays showed a nondisplaced lateral malleolus fracture on the left.  She was given posterior splint and crutches.  She returns for CAM walker and further treatment.  She has no other injury.  History reviewed. No pertinent past medical history.  Past Surgical History  Procedure Date  . Cholecystectomy   . Cesarean section     No family history on file.  Social History:  reports that she has been smoking.  She does not have any smokeless tobacco history on file. She reports that she drinks alcohol. She reports that she does not use illicit drugs.  Allergies: No Known Allergies  Medications: I have reviewed the patient's current medications.  No results found for this or any previous visit (from the past 48 hour(s)).  Dg Ankle Complete Right  09/26/2011  *RADIOLOGY REPORT*  Clinical Data: Fall  RIGHT ANKLE - COMPLETE 3+ VIEW  Comparison: None.  Findings: Oblique, nondisplaced intra-articular fracture of the distal fibula is identified. No additional fractures or subluxations identified.  There is lateral soft tissue swelling.  IMPRESSION:  1.  Nondisplaced distal fibular fracture.  Original Report Authenticated By: Rosealee Albee, M.D.   Ct Head Wo Contrast  09/26/2011  *RADIOLOGY REPORT*  Clinical Data:  Fall.  Trauma to the head and neck.  Pain and bleeding.  CT HEAD WITHOUT CONTRAST CT CERVICAL SPINE WITHOUT CONTRAST  Technique:  Multidetector CT imaging of the head and cervical spine was performed following the standard protocol without intravenous contrast.  Multiplanar CT image reconstructions of the cervical spine were also generated.  Comparison:  01/08/2008  CT HEAD  Findings: The brain has a normal appearance without evidence of malformation, atrophy, old or acute infarction,  mass lesion, hemorrhage, hydrocephalus or extra-axial collection.  No skull fracture.  No fluid in the sinuses, middle ears or mastoids.  IMPRESSION: Normal head CT  CT CERVICAL SPINE  Findings: Alignment is normal.  No fracture.  No soft tissue swelling.  No degenerative change or other focal lesion.  IMPRESSION: Normal C-spine CT  Original Report Authenticated By: Thomasenia Sales, M.D.   Ct Cervical Spine Wo Contrast  09/26/2011  *RADIOLOGY REPORT*  Clinical Data:  Fall.  Trauma to the head and neck.  Pain and bleeding.  CT HEAD WITHOUT CONTRAST CT CERVICAL SPINE WITHOUT CONTRAST  Technique:  Multidetector CT imaging of the head and cervical spine was performed following the standard protocol without intravenous contrast.  Multiplanar CT image reconstructions of the cervical spine were also generated.  Comparison:  01/08/2008  CT HEAD  Findings: The brain has a normal appearance without evidence of malformation, atrophy, old or acute infarction, mass lesion, hemorrhage, hydrocephalus or extra-axial collection.  No skull fracture.  No fluid in the sinuses, middle ears or mastoids.  IMPRESSION: Normal head CT  CT CERVICAL SPINE  Findings: Alignment is normal.  No fracture.  No soft tissue swelling.  No degenerative change or other focal lesion.  IMPRESSION: Normal C-spine CT  Original Report Authenticated By: Thomasenia Sales, M.D.    Review of Systems  Musculoskeletal: Positive for falls (She fell at home two days ago and hurt the right lateral ankle.  She has swelling.  She was in a posterior splint.).  All other systems reviewed and are negative.  Blood pressure 115/87, pulse 67, temperature 98.5 F (36.9 C), temperature source Oral, resp. rate 18, height 5\' 6"  (1.676 m), weight 68.04 kg (150 lb), last menstrual period 09/22/2011, SpO2 100.00%. Physical Exam  Constitutional: She is oriented to person, place, and time. She appears well-developed and well-nourished.  HENT:  Head: Normocephalic and  atraumatic.  Eyes: Conjunctivae and EOM are normal. Pupils are equal, round, and reactive to light.  Neck: Normal range of motion. Neck supple.  Cardiovascular: Normal rate, regular rhythm, normal heart sounds and intact distal pulses.   Respiratory: Effort normal and breath sounds normal.  GI: Soft. Bowel sounds are normal.  Musculoskeletal: She exhibits tenderness (right ankle lateral pain and ecchymosis and swelling.  NV is intact.  ROM is decreased.).       Feet:  Neurological: She is alert and oriented to person, place, and time. She has normal reflexes.  Skin: Skin is warm and dry.  Psychiatric: She has a normal mood and affect. Her behavior is normal. Judgment and thought content normal.    Assessment/Plan: Nondisplaced right lateral malleolus fracture.  I will give CAM walker and Rx for Norco 7.5.  She was given instructions for use of the CAM walker.  I will see in my office in one week.  X-rays at hospital prior to the appointment.  Caroline Bishop 09/27/2011, 10:26 AM

## 2011-09-27 NOTE — ED Notes (Signed)
Pt seen in ED yesterday for broken right ankle. Pt instructed to follow up with Dr. Romeo Apple for casting. Pt stated that Dr. Romeo Apple office instructed to follow up with Dr. Hilda Lias. Pt c/o of numbness and pain in toes. NAD

## 2012-12-29 LAB — OB RESULTS CONSOLE HEPATITIS B SURFACE ANTIGEN: HEP B S AG: NEGATIVE

## 2013-04-29 LAB — OB RESULTS CONSOLE HGB/HCT, BLOOD
HCT: 31 %
HEMOGLOBIN: 10.6 g/dL

## 2013-04-29 LAB — OB RESULTS CONSOLE HIV ANTIBODY (ROUTINE TESTING): HIV: NONREACTIVE

## 2013-04-29 LAB — OB RESULTS CONSOLE PLATELET COUNT: Platelets: 251 10*3/uL

## 2013-04-29 LAB — OB RESULTS CONSOLE RPR: RPR: NONREACTIVE

## 2013-05-08 LAB — CYTOLOGY - PAP: PAP SMEAR: NEGATIVE

## 2013-05-20 ENCOUNTER — Encounter: Payer: Self-pay | Admitting: *Deleted

## 2013-05-28 ENCOUNTER — Encounter: Payer: Self-pay | Admitting: Obstetrics & Gynecology

## 2013-06-01 ENCOUNTER — Encounter: Payer: Self-pay | Admitting: Obstetrics & Gynecology

## 2013-06-11 ENCOUNTER — Ambulatory Visit (INDEPENDENT_AMBULATORY_CARE_PROVIDER_SITE_OTHER): Payer: Self-pay | Admitting: Family

## 2013-06-11 ENCOUNTER — Encounter: Payer: Self-pay | Admitting: Family

## 2013-06-11 ENCOUNTER — Other Ambulatory Visit: Payer: Self-pay | Admitting: Family

## 2013-06-11 VITALS — BP 119/88 | Temp 98.6°F | Wt 162.2 lb

## 2013-06-11 DIAGNOSIS — O09529 Supervision of elderly multigravida, unspecified trimester: Secondary | ICD-10-CM

## 2013-06-11 DIAGNOSIS — R760 Raised antibody titer: Secondary | ICD-10-CM | POA: Insufficient documentation

## 2013-06-11 DIAGNOSIS — Z3490 Encounter for supervision of normal pregnancy, unspecified, unspecified trimester: Secondary | ICD-10-CM

## 2013-06-11 DIAGNOSIS — D6862 Lupus anticoagulant syndrome: Secondary | ICD-10-CM | POA: Insufficient documentation

## 2013-06-11 DIAGNOSIS — Z9889 Other specified postprocedural states: Secondary | ICD-10-CM

## 2013-06-11 DIAGNOSIS — Z23 Encounter for immunization: Secondary | ICD-10-CM

## 2013-06-11 DIAGNOSIS — Z3480 Encounter for supervision of other normal pregnancy, unspecified trimester: Secondary | ICD-10-CM

## 2013-06-11 DIAGNOSIS — Z98891 History of uterine scar from previous surgery: Secondary | ICD-10-CM | POA: Insufficient documentation

## 2013-06-11 DIAGNOSIS — D6859 Other primary thrombophilia: Secondary | ICD-10-CM

## 2013-06-11 DIAGNOSIS — O0990 Supervision of high risk pregnancy, unspecified, unspecified trimester: Secondary | ICD-10-CM

## 2013-06-11 LAB — POCT URINALYSIS DIP (DEVICE)
Bilirubin Urine: NEGATIVE
Glucose, UA: NEGATIVE mg/dL
Ketones, ur: NEGATIVE mg/dL
Leukocytes, UA: NEGATIVE
Nitrite: NEGATIVE
PH: 7 (ref 5.0–8.0)
PROTEIN: NEGATIVE mg/dL
SPECIFIC GRAVITY, URINE: 1.025 (ref 1.005–1.030)
UROBILINOGEN UA: 0.2 mg/dL (ref 0.0–1.0)

## 2013-06-11 NOTE — Progress Notes (Signed)
HSV II; lupus  Subjective:    Caroline Bishop is a [id number] 5858w4d being seen today for her first obstetrical visit.  Her obstetrical history is significant for advanced maternal age and lupus.  . Patient does intend to breast feed. Pregnancy history fully reviewed.  Lengthy discussion regarding genital herpes listed in her Epic documentation.  Patient states she has never had lesions or informed that she had herpes.  She was very upset and did not want any of it discussed in front of boyfriend.  Positive test or notes could not be found listing genital herpes as diagnosis.  It was checked under medical history.    Patient reports no bleeding, no contractions and no cramping.  Filed Vitals:   06/11/13 0956  BP: 119/88  Temp: 98.6 F (37 C)  Weight: 162 lb 3.2 oz (73.573 kg)    HISTORY: OB History  Gravida Para Term Preterm AB SAB TAB Ectopic Multiple Living  4 2 2  1  1   2     # Outcome Date GA Lbr Len/2nd Weight Sex Delivery Anes PTL Lv  4 CUR           3 TRM 02/2005 2479w0d  7 lb 11 oz (3.487 kg) M LTCS EPI  Y     Comments: gall bladder removed during pregnancy, fetal distres; [facility]M 07/18[birth year] 5679w0d  7 lb 13 oz (3.544 kg) F SVD None  Y     Comments: no complications, born [facility]att[facility]  1 TAB 1993             Past Medical History  Diagnosis Date  . 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 Cholecystectomy    . Cesarean section     Family History  Problem Relation Age of Onset  . Lung cancer Mother   . Aneurysm Father     Brain     Exam   Filed Vitals:   06/11/13 0956  BP: 119/88  Temp: 98.6 F (37 C)   Exam   BP 119/88  Temp(Src) 98.6 F (37 C)  Wt 162 lb 3.2 oz (73.573 kg)  LMP 03/22/2013  Breastfeeding? Unknown Uterine Size: size greater than dates; measures at the umbilicus  Pelvic Exam:    Perineum: No Hemorrhoids, Normal Perineum   Vulva: normal   Vagina:  normal mucosa, normal discharge, no palpable nodules   pH: Not done   Cervix: no bleeding following Pap, no cervical motion tenderness and no lesions   Adnexa: normal adnexa and no mass, fullness, tenderness   Bony Pelvis: Adequate  System: Breast:  No nipple retraction or dimpling, No nipple discharge or bleeding, No axillary or supraclavicular adenopathy, Normal to palpation without dominant masses   Skin: normal coloration and turgor, no rashes    Neurologic: negative   Extremities: normal strength, tone, and muscle mass   HEENT neck supple with midline trachea and thyroid without masses   Mouth/Teeth mucous membranes moist, pharynx normal without lesions   Neck supple and no masses   Cardiovascular: regular rate and rhythm, no murmurs or gallops   Respiratory:  appears well, vitals normal, no respiratory distress, acyanotic, normal RR, neck free of mass or lymphadenopathy, chest clear, no wheezing, crepitations, rhonchi, normal symmetric air entry   Abdomen: soft, non-tender; bowel sounds normal; no masses,  no organomegaly   Urinary: urethral meatus normal  Assessment:    Pregnancy:   38 yo G4P2012 at unknown gestation (measuring large for dates) Lupus Anticoagulant + Prior Csection Advanced Maternal Age Large for Dates - Uncertain Gestational Age   Patient Active Problem List   Diagnosis Date Noted  . 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 H/O: C-section 06/11/2013  . Supervision of high-risk pregnancy 06/11/2013  . ANKLE SPRAIN 01/28/2008        Plan:     Initial labs drawn. Prenatal vitamins. Begin baby ASA and refer to rheumatologist when insurance active.  Problem list reviewed and updated. Genetic Screening discussed:  Refer to MFM for genetic counseling and plan of care for lupus anticoagulant.  Ultrasound discussed; fetal survey: ordered for dating. HSV labs ordered.    Follow up in 3 weeks.Eino Farber Paul Half 06/11/2013

## 2013-06-11 NOTE — Addendum Note (Signed)
Addended by: Gerome ApleyZEYFANG, Ramadan Couey L on: 06/11/2013 02:47 PM   Modules accepted: Orders

## 2013-06-11 NOTE — Progress Notes (Signed)
Nutrition note: 1st visit consult Pt has gained 6.2# @ 1450w4d, which is wnl. Pt reports eating 3 meals & 3 snacks/d. Pt is taking a PNV. Pt reports having nausea and heartburn but no vomiting. Pt received verbal & written education on general nutrition during pregnancy. Discussed tips to decrease nausea & heartburn. Discussed wt gain goals of 15-25# or 0.6#/wk. Pt agrees to continue taking PNV. Pt does not have WIC but plans to apply. Pt plans to BF. F/u as needed Blondell RevealLaura Soniyah Mcglory, MS, RD, LDN, [facility]

## 2013-06-11 NOTE — Progress Notes (Signed)
U/S and Genetic Counseling scheduled 06/18/13 at 2 pm.

## 2013-06-11 NOTE — Progress Notes (Signed)
P=49, Here for Initial prenatal care. Given new patient information. Discussed bmi/ appropriate weight gain.

## 2013-06-12 LAB — HIV ANTIBODY (ROUTINE TESTING W REFLEX): HIV: NONREACTIVE

## 2013-06-12 LAB — OBSTETRIC PANEL
ANTIBODY SCREEN: NEGATIVE
BASOS ABS: 0 10*3/uL (ref 0.0–0.1)
Basophils Relative: 0 % (ref 0–1)
EOS ABS: 0.1 10*3/uL (ref 0.0–0.7)
EOS PCT: 1 % (ref 0–5)
HEMATOCRIT: 31.6 % — AB (ref 36.0–46.0)
Hemoglobin: 10.9 g/dL — ABNORMAL LOW (ref 12.0–15.0)
Hepatitis B Surface Ag: NEGATIVE
Lymphocytes Relative: 15 % (ref 12–46)
Lymphs Abs: 1.4 10*3/uL (ref 0.7–4.0)
MCH: 30.4 pg (ref 26.0–34.0)
MCHC: 34.5 g/dL (ref 30.0–36.0)
MCV: 88 fL (ref 78.0–100.0)
MONO ABS: 0.5 10*3/uL (ref 0.1–1.0)
Monocytes Relative: 6 % (ref 3–12)
Neutro Abs: 7 10*3/uL (ref 1.7–7.7)
Neutrophils Relative %: 78 % — ABNORMAL HIGH (ref 43–77)
PLATELETS: 259 10*3/uL (ref 150–400)
RBC: 3.59 MIL/uL — ABNORMAL LOW (ref 3.87–5.11)
RDW: 15 % (ref 11.5–15.5)
Rh Type: POSITIVE
Rubella: 8.89 Index — ABNORMAL HIGH (ref <0.90)
WBC: 9 10*3/uL (ref 4.0–10.5)

## 2013-06-12 LAB — GC/CHLAMYDIA PROBE AMP
CT PROBE, AMP APTIMA: NEGATIVE
GC PROBE AMP APTIMA: NEGATIVE

## 2013-06-12 LAB — PRESCRIPTION MONITORING PROFILE (19 PANEL)
AMPHETAMINE/METH: NEGATIVE ng/mL
BARBITURATE SCREEN, URINE: NEGATIVE ng/mL
Benzodiazepine Screen, Urine: NEGATIVE ng/mL
Buprenorphine, Urine: NEGATIVE ng/mL
CANNABINOID SCRN UR: NEGATIVE ng/mL
CARISOPRODOL, URINE: NEGATIVE ng/mL
CREATININE, URINE: 146.81 mg/dL (ref >20.0)
Cocaine Metabolites: NEGATIVE ng/mL
Fentanyl, Ur: NEGATIVE ng/mL
MDMA URINE: NEGATIVE ng/mL
METHADONE SCREEN, URINE: NEGATIVE ng/mL
Meperidine, Ur: NEGATIVE ng/mL
Methaqualone: NEGATIVE ng/mL
NITRITES URINE, INITIAL: NEGATIVE ug/mL
OPIATE SCREEN, URINE: NEGATIVE ng/mL
OXYCODONE SCRN UR: NEGATIVE ng/mL
PHENCYCLIDINE, UR: NEGATIVE ng/mL
PROPOXYPHENE: NEGATIVE ng/mL
TAPENTADOLUR: NEGATIVE ng/mL
Tramadol Scrn, Ur: NEGATIVE ng/mL
Zolpidem, Urine: NEGATIVE ng/mL
pH, Initial: 7.1 pH (ref 4.5–8.9)

## 2013-06-13 LAB — CULTURE, OB URINE: Colony Count: 6000

## 2013-06-15 LAB — HSV(HERPES SMPLX)ABS-I+II(IGG+IGM)-BLD
HSV 2 GLYCOPROTEIN G AB, IGG: 8.11 IV — AB
Herpes Simplex Vrs I&II-IgM Ab (EIA): 0.54 INDEX

## 2013-06-15 LAB — HEMOGLOBINOPATHY EVALUATION
HGB A: 97.1 % (ref 96.8–97.8)
HGB F QUANT: 0 % (ref 0.0–2.0)
HGB S QUANTITAION: 0 %
Hemoglobin Other: 0 %
Hgb A2 Quant: 2.9 % (ref 2.2–3.2)

## 2013-06-16 ENCOUNTER — Encounter: Payer: Self-pay | Admitting: Family

## 2013-06-16 DIAGNOSIS — B009 Herpesviral infection, unspecified: Secondary | ICD-10-CM | POA: Insufficient documentation

## 2013-06-16 NOTE — Progress Notes (Signed)
  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  Explained will need to start on acyclovir at 36 weeks.  Pt verbalized not wanting to due to never having an outbreak.  Explained why medication is given and implications if lesions are found in labor (csection).

## 2013-06-18 ENCOUNTER — Ambulatory Visit (HOSPITAL_COMMUNITY)
Admission: RE | Admit: 2013-06-18 | Discharge: 2013-06-18 | Disposition: A | Discharge status: Home / Self Care | Payer: Non-veteran care | Source: Ambulatory Visit | Attending: Family | Admitting: Family

## 2013-06-18 ENCOUNTER — Ambulatory Visit (HOSPITAL_COMMUNITY): Admission: RE | Admit: 2013-06-18 | Payer: Non-veteran care | Source: Ambulatory Visit

## 2013-06-18 DIAGNOSIS — O09529 Supervision of elderly multigravida, unspecified trimester: Secondary | ICD-10-CM | POA: Insufficient documentation

## 2013-06-18 DIAGNOSIS — Z3490 Encounter for supervision of normal pregnancy, unspecified, unspecified trimester: Secondary | ICD-10-CM

## 2013-06-25 ENCOUNTER — Encounter: Payer: Self-pay | Admitting: Family

## 2013-06-26 ENCOUNTER — Encounter: Payer: Self-pay | Admitting: Family

## 2013-06-29 ENCOUNTER — Encounter (HOSPITAL_COMMUNITY): Payer: Self-pay | Admitting: *Deleted

## 2013-06-29 NOTE — Progress Notes (Signed)
Spoke with Victorino DikeJennifer regarding patient's VA choice healthcare paperwork that was sent for prior autorization for her Panorama testing.  It was sent out on April 17th.  It should take about 7-14 days for a response.  Will follow up so lab work can be scheduled.

## 2013-07-09 ENCOUNTER — Encounter: Payer: Self-pay | Admitting: Family

## 2013-07-18 ENCOUNTER — Encounter: Payer: Self-pay | Admitting: *Deleted

## 2014-01-04 ENCOUNTER — Encounter: Payer: Self-pay | Admitting: Family

## 2014-01-18 ENCOUNTER — Encounter (HOSPITAL_BASED_OUTPATIENT_CLINIC_OR_DEPARTMENT_OTHER): Payer: Self-pay | Admitting: *Deleted

## 2014-01-18 ENCOUNTER — Emergency Department (HOSPITAL_BASED_OUTPATIENT_CLINIC_OR_DEPARTMENT_OTHER)
Admission: EM | Admit: 2014-01-18 | Discharge: 2014-01-18 | Disposition: A | Discharge status: Home / Self Care | Payer: Non-veteran care | Attending: Emergency Medicine | Admitting: Emergency Medicine

## 2014-01-18 DIAGNOSIS — Z79899 Other long term (current) drug therapy: Secondary | ICD-10-CM | POA: Diagnosis not present

## 2014-01-18 DIAGNOSIS — L02412 Cutaneous abscess of left axilla: Secondary | ICD-10-CM

## 2014-01-18 DIAGNOSIS — Z87891 Personal history of nicotine dependence: Secondary | ICD-10-CM | POA: Insufficient documentation

## 2014-01-18 DIAGNOSIS — Z7951 Long term (current) use of inhaled steroids: Secondary | ICD-10-CM | POA: Insufficient documentation

## 2014-01-18 DIAGNOSIS — L02511 Cutaneous abscess of right hand: Secondary | ICD-10-CM | POA: Insufficient documentation

## 2014-01-18 DIAGNOSIS — L02411 Cutaneous abscess of right axilla: Secondary | ICD-10-CM

## 2014-01-18 MED ORDER — CLINDAMYCIN HCL 150 MG PO CAPS: 450.0000 mg | ORAL_CAPSULE | Freq: Three times a day (TID) | ORAL | Status: DC

## 2014-01-18 MED ORDER — HYDROCODONE-ACETAMINOPHEN 5-325 MG PO TABS: 1.0000 | ORAL_TABLET | Freq: Four times a day (QID) | ORAL | Status: DC | PRN

## 2014-01-18 MED ORDER — LIDOCAINE HCL (PF) 1 % IJ SOLN
5.0000 mL | Freq: Once | INTRAMUSCULAR | Status: AC
  Administered 2014-01-18: 5 mL
  Filled 2014-01-18: qty 5

## 2014-01-18 MED ORDER — OXYCODONE-ACETAMINOPHEN 5-325 MG PO TABS
2.0000 | ORAL_TABLET | Freq: Once | ORAL | Status: AC
  Administered 2014-01-18: 2 via ORAL
  Filled 2014-01-18: qty 2

## 2014-01-18 NOTE — Discharge Instructions (Signed)
Abscess °An abscess is an infected area that contains a collection of pus and debris. It can occur in almost any part of the body. An abscess is also known as a furuncle or boil. °CAUSES  °An abscess occurs when tissue gets infected. This can occur from blockage of oil or sweat glands, infection of hair follicles, or a minor injury to the skin. As the body tries to fight the infection, pus collects in the area and creates pressure under the skin. This pressure causes pain. People with weakened immune systems have difficulty fighting infections and get certain abscesses more often.  °SYMPTOMS °Usually an abscess develops on the skin and becomes a painful mass that is red, warm, and tender. If the abscess forms under the skin, you may feel a moveable soft area under the skin. Some abscesses break open (rupture) on their own, but most will continue to get worse without care. The infection can spread deeper into the body and eventually into the bloodstream, causing you to feel ill.  °DIAGNOSIS  °Your caregiver will take your medical history and perform a physical exam. A sample of fluid may also be taken from the abscess to determine what is causing your infection. °TREATMENT  °Your caregiver may prescribe antibiotic medicines to fight the infection. However, taking antibiotics alone usually does not cure an abscess. Your caregiver may need to make a small cut (incision) in the abscess to drain the pus. In some cases, gauze is packed into the abscess to reduce pain and to continue draining the area. °HOME CARE INSTRUCTIONS  °· Only take over-the-counter or prescription medicines for pain, discomfort, or fever as directed by your caregiver. °· If you were prescribed antibiotics, take them as directed. Finish them even if you start to feel better. °· If gauze is used, follow your caregiver's directions for changing the gauze. °· To avoid spreading the infection: °¨ Keep your draining abscess covered with a  bandage. °¨ Wash your hands well. °¨ Do not share personal care items, towels, or whirlpools with others. °¨ Avoid skin contact with others. °· Keep your skin and clothes clean around the abscess. °· Keep all follow-up appointments as directed by your caregiver. °SEEK MEDICAL CARE IF:  °· You have increased pain, swelling, redness, fluid drainage, or bleeding. °· You have muscle aches, chills, or a general ill feeling. °· You have a fever. °MAKE SURE YOU:  °· Understand these instructions. °· Will watch your condition. °· Will get help right away if you are not doing well or get worse. °Document Released: 11/29/2004 Document Revised: 08/21/2011 Document Reviewed: 05/04/2011 °ExitCare® Patient Information ©2015 [employer]. This information is not intended to replace advice given to you by your health care provider. Make sure you discuss any questions you have with your health care provider. ° ° °Emergency Department Resource Guide °1) Find a Doctor and Pay Out of Pocket °Although you won't have to find out who is covered by your insurance plan, it is a good idea to ask around and get recommendations. You will then need to call the office and see if the doctor you have chosen will accept you as a new patient and what types of options they offer for patients who are self-pay. Some doctors offer discounts or will set up payment plans for their patients who do not have insurance, but you will need to ask so you aren't surprised when you get to your appointment. ° °2) Contact Your Local Health Department °Not all health departments   have doctors that can see patients for sick visits, but many do, so it is worth a call to see if yours does. If you don't know where your local health department is, you can check in your phone book. The CDC also has a tool to help you locate your state's health department, and many state websites also have listings of all of their local health departments. ° °3) Find a Walk-in Clinic °If  your illness is not likely to be very severe or complicated, you may want to try a walk in clinic. These are popping up all over the country in pharmacies, drugstores, and shopping centers. They're usually staffed by nurse practitioners or physician assistants that have been trained to treat common illnesses and complaints. They're usually fairly quick and inexpensive. However, if you have serious medical issues or chronic medical problems, these are probably not your best option. ° °No Primary Care Doctor: °- Call [facility] at  832-8000 - they can help you locate a primary care doctor that  accepts your insurance, provides certain services, etc. °- Physician Referral Service- 1-800-533-3463 ° °Chronic Pain Problems: °Organization         Address  Phone   Notes  °[facility]  (336) 297-2271 Patients need to be referred by their primary care doctor.  ° °Medication Assistance: °Organization         Address  Phone   Notes  °[place] [facility] [address] °[place], [place] [address] (336) 641-8030 --Must be a resident of [place] °-- Must have NO insurance coverage whatsoever (no Medicaid/ Medicare, etc.) °-- The pt. MUST have a primary care doctor that directs their care regularly and follows them in the community °  °[facility]  (866) 331-1348   °[employer]  (888) 892-1162   ° °Agencies that provide inexpensive medical care: °Organization         Address  Phone   Notes  °[facility]  (336) 832-8035   °[facility]    (336) 832-7272   °[facility] [address] °[place], [place] [address] (336) 832-4777   °[facility] [address], °[place] (336) 271-4999   °[facility]    (336) 373-0678   °[facility]    (336) 272-1050   °[facility] ° [address], [place] Phone:  (336) 832-4444, Fax:  (336) 832-4440 Hours of  Operation:  9 am - 6 pm, M-F.  Also accepts Medicaid/Medicare and self-pay.  °[facility] ° [address], [place] Phone: (336) 832-3150, Fax: (336) 832-3151. Hours of Operation:  8:30 am - 5:30 pm, M-F.  Also accepts Medicaid and self-pay.  °[facility] [place] [address], [place] Phone: (336) 878-6027   °[facility] [address], [place], [place] (336)723-1848, Ext. 123 Mondays & Thursdays: 7-9 AM.  First 15 patients are seen on a first come, first serve basis. °  ° °Medicaid-accepting [place] Providers: ° °Organization         Address  Phone   Notes  °[facility] [address], [place] (336) 641-2100 Also accepts self-pay patients.  °[facility] [address], [place] ° (336) 856-9996   °[facility] [address], [place] (336) 288-8857   °[facility]   [address] [place] [address], [place] (336) 299-7000   °Veita Bland [address], [place]  ° (336) 373-1557 Only accepts [place] Access Medicaid patients after they have their name applied to their card.  ° °Self-Pay (no insurance) in [place]: ° °Organization         Address  Phone   Notes  °Sickle Cell Patients, [facility] [address], [place] (336) 832-1970   °[facility] [address], [place] (336) 832-4400   °[facility] ° [address], [place] (336) 992-4800   °[facility]/Dr. Osei-Bonsu ° [address] [place] [address], [place] or [address], [place] (336) 841-8500 Phone number for both [place] and [place] locations is the same.  °[facility] [address], [place] (336) 299-0000   °[facility] [address] [place] [address], [place] or [address] (336) 852-7530 °(336) 878-2260   °[facility] [address], [place] (336) 350-1642, phone; (336) 294-5005, fax Sees patients 1st and 3rd Saturday of every month.  Must not qualify for public or private insurance (i.e. Medicaid, Medicare, [place] Health Choice, Veterans' Benefits) • Household income should be no more than 200% of the poverty level •The clinic cannot treat you if you are pregnant or think you are pregnant • Sexually transmitted diseases are not treated at the clinic.  ° ° °Dental Care: °Organization         Address  Phone  Notes  °[facility] [address], [place] (336) 641-6152 Accepts children up to age 21 who are enrolled in Medicaid or [place] Health Choice; pregnant women with a Medicaid card; and children who have applied for Medicaid or [place] Health Choice, but were declined, whose parents can pay a reduced fee at time of service.  °[facility] [place]  [address], [place] (336) 641-7733 Accepts children up to age 21 who are enrolled in Medicaid or [place] Health Choice; pregnant women with a Medicaid card; and children who have applied for Medicaid or  Health Choice, but were declined, whose parents can pay a reduced fee at time of service.  °[facility] ° [address], [place] (336) 641-4533 Patients are seen by appointment only. Walk-ins are not accepted. [facility] will see patients 18 years of age and older. °Monday - Tuesday (8am-5pm) °Most Wednesdays (8:30-5pm) °$30 per visit, cash only  °[facility] ° [address], [place] (336) 641-4533 Patients are seen by appointment only. Walk-ins are not accepted. [facility] will see patients 18 years of age and older. °One Wednesday Evening (Monthly: Volunteer Based).  $30 per visit, cash only  °[facility]  (919) 537-3737 for adults; Children under age 4, call Graduate Pediatric  Dentistry at (919) [phone]. Children aged 4-14, please call (919) [phone] to request a pediatric application. ° Dental services are provided in all areas of dental care including fillings, crowns and bridges, complete and partial dentures, implants, gum treatment, root canals, and extractions. Preventive care is also provided. Treatment is provided to both adults and children. °Patients are selected via a lottery and there is often a waiting list. °  °[facility] [address], °[place] ° (336) 763-8833 [email or web address] °  °[facility]   [address], [place], [place] (336)723-1848, Ext. 123 Second and Fourth Thursday of each month, opens at 6:30 AM; Clinic ends at 9 AM.  Patients are seen on a first-come first-served basis, and a limited number are seen during each clinic.  ° °[facility] ° [address], [place], [place] (336) 723-7904   Eligibility Requirements °You must have lived in [place], [place], or [place] counties for at least the last three months. °  You cannot be eligible for state or federal sponsored healthcare insurance, including Veterans Administration, Medicaid, or Medicare. °  You generally cannot be eligible for healthcare insurance through your employer.  °  How to apply: °Eligibility screenings are held every Tuesday and Wednesday afternoon from 1:00 pm until 4:00 pm. You do not need an appointment for the interview!  °[facility] [address], [place], [place] 336-631-2330   °[facility]  336-342-8273   °[facility]  336-703-3100   °[facility]  336-570-6415   ° °Behavioral Health Resources in the Community: °Intensive Outpatient Programs °Organization         Address  Phone  Notes  °[facility] [address], [place], [place] 336-878-6098   °[facility] [address], [place], [place] 336-832-9800   °[facility] [address], [place], [place] ° 336-882-2125   °[facility] [address],  °[place], [place] 1-800-853-5163 or 336-641-4981   °Substance Abuse Resources °Organization         Address  Phone  Notes  °[facility]  336-882-2125   °[facility]  336-784-9470   °[facility]  336-285-9073   °[facility]  336-845-3988   °Residential & Outpatient Substance Abuse Program  1-800-659-3381   °Psychological Services °Organization         Address  Phone  Notes  °[facility]  336- 832-9600   °[facility]  336- 378-7881   °[facility] [address], [place] 1-800-853-5163 or 336-641-4981   ° °Mobile Crisis Teams °Organization         Address  Phone  Notes  °[facility], Mobile Crisis Care Unit  1-877-626-1772   °Assertive °[facility] ° [address] [place], [place] 336-834-9664   °Sharon DeEsch [address] °[place] 336-554-5454   ° °Self-Help/Support Groups °Organization         Address  Phone             Notes  °[facility] - variety of support groups  336- 373-1402 Call for more information  °Narcotics Anonymous (NA), [facility] [address], °[place]  2 meetings at this location  ° °Residential Treatment Programs °Organization         Address  Phone  Notes  °[facility] [address],    °[place]  1-866-801-8205   °[facility] ° [address], [place], [place] 704-293-8524   °[facility] [address], [place] 336-845-3988 Admissions: 8am-3pm M-F  °[facility] [address]    °[place], [place] 336-841-1104   °[facility] [address], [place], [place] 336-379-7146   °[facility] [address]  °[place], [place] 336-285-9073   °[facility] - Intensive Outpatient [address],    [place], [place] 336-852-3033   °[facility] [address]  °[place], [place] 1-877-615-2722 or 336-784-9470   °[facility] [address] [place], [place] 336-227-7417 Accepts Medicaid  °[facility] [address]  °[place] 1-800-659-3381 Substance Abuse/Addiction Treatment  ° °[place] Behavioral Health Resources °Organization         Address  Phone  Notes  °[facility]  (888) 581-9988   °Julie Brannon, PhD [address] [place], [place]   (336) 349-5553 or (336) 951-0000   °[facility]   [address] °[place], [place] (336) 349-4454   °[facility] [address], [place], [place] (336) 342-8316 Insurance/Medicaid/sponsorship through [facility]  °[facility] [address]                                    [place], [place] (336) 342-8316 Therapy/tele-psych/case  °[facility] [address]  ° [place], [place] (336) 349-2233    °Dr. Arfeen  (336) 349-4544   °[facility] [place]  [employer] [place] [facility] 1) [address], [place] °2) [address], [place] °3)  [address], [place] (336) 349-3220 °(336) 342-7768 ° °(336) 342-8140   °[place] [facility] (336) 342-1394 or (336) 342-3537 (After Hours)    ° ° ° °

## 2014-01-18 NOTE — ED Notes (Addendum)
Pt c/o abscess bil axillary x 3 days Pt has muliple brusing to bil arms, back, leg, abd, hands and feet.
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
## 2014-07-22 ENCOUNTER — Emergency Department (HOSPITAL_COMMUNITY): Payer: Non-veteran care

## 2014-07-22 ENCOUNTER — Encounter (HOSPITAL_COMMUNITY): Payer: Self-pay | Admitting: Emergency Medicine

## 2014-07-22 ENCOUNTER — Emergency Department (HOSPITAL_COMMUNITY)
Admission: EM | Admit: 2014-07-22 | Discharge: 2014-07-23 | Disposition: A | Discharge status: Home / Self Care | Payer: Non-veteran care | Attending: Emergency Medicine | Admitting: Emergency Medicine

## 2014-07-22 DIAGNOSIS — Z7952 Long term (current) use of systemic steroids: Secondary | ICD-10-CM | POA: Insufficient documentation

## 2014-07-22 DIAGNOSIS — Z79899 Other long term (current) drug therapy: Secondary | ICD-10-CM | POA: Diagnosis not present

## 2014-07-22 DIAGNOSIS — Z792 Long term (current) use of antibiotics: Secondary | ICD-10-CM | POA: Insufficient documentation

## 2014-07-22 DIAGNOSIS — L03211 Cellulitis of face: Secondary | ICD-10-CM | POA: Diagnosis not present

## 2014-07-22 DIAGNOSIS — Z87891 Personal history of nicotine dependence: Secondary | ICD-10-CM | POA: Diagnosis not present

## 2014-07-22 DIAGNOSIS — R220 Localized swelling, mass and lump, head: Secondary | ICD-10-CM | POA: Diagnosis present

## 2014-07-22 MED ORDER — HYDROMORPHONE HCL 1 MG/ML IJ SOLN
1.0000 mg | Freq: Once | INTRAMUSCULAR | Status: AC
  Administered 2014-07-23: 1 mg via INTRAVENOUS
  Filled 2014-07-22: qty 1

## 2014-07-22 MED ORDER — ONDANSETRON HCL 4 MG/2ML IJ SOLN
4.0000 mg | Freq: Once | INTRAMUSCULAR | Status: AC
  Administered 2014-07-23: 4 mg via INTRAVENOUS
  Filled 2014-07-22: qty 2

## 2014-07-22 NOTE — ED Notes (Signed)
Patient reports had bump to nose that she popped about 4 days ago and now the swelling has increased. Redness and swelling noted to nose and upper lip.

## 2014-07-22 NOTE — ED Provider Notes (Signed)
CSN: [id number]     Arrival date & time 07/22/14  2000 History   First MD Initiated Contact with Patient 07/22/14 2313     Chief Complaint  Patient presents with  . Facial Swelling     (Consider location/radiation/quality/duration/timing/severity/associated sxs/prior Treatment) HPI Comments: Patient is a 39 year old female who presents to the emergency department with a complaint of facial swelling.  Patient states that approximately 4 days ago she noticed a pimple on her nose. On the second day she popped it, and after this she started noticing increasing swelling involving her nose. Earlier today she noted swelling of the nose as well as the upper lip and the right side of her face. She states the problem continued to get worse, she actually noted some drainage from the nose and noted increased redness present so she came to the emergency department for evaluation. She states that probably on day 2 and may be day 3 she felt like she was hot and cold. She did not measure a temperature. She complains of pain when she blows her nose. She complains of pain when she touches her nose or her upper lip. She states that her teeth hurt. There is no problem with her breathing.  The history is provided by the patient.    Past Medical History  Diagnosis Date  . 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 Cholecystectomy    . Cesarean section     Family History  Problem Relation Age of Onset  . Lung cancer Mother   . Aneurysm Father     Brain   History  Substance Use Topics  . Smoking status: Former Smoker -- 0.25 packs/day    Quit date: 04/13/2013  . Smokeless tobacco: Never Used  . Alcohol Use: Yes     Comment: stopped with pregnancy   OB History    Gravida Para Term Preterm AB TAB SAB Ectopic Multiple Living   4 2 2  1 1    2      Review of Systems  HENT: Positive for sinus pressure.   Skin:  Positive for wound.  All other systems reviewed and are negative.     Allergies  Review of patient's allergies indicates no known allergies.  Home Medications   Prior to Admission medications   Medication Sig Start Date End Date Taking? Authorizing Provider  clindamycin (CLEOCIN) 150 MG capsule Take 3 capsules (450 mg total) by mouth 3 (three) times daily. For full 10 days 01/18/14   Toy CookeyMegan Docherty, MD  HYDROcodone-acetaminophen (NORCO) 5-325 MG per tablet Take 1 tablet by mouth every 6 (six) hours as needed. 01/18/14   Toy CookeyMegan Docherty, MD  hydroxychloroquine (PLAQUENIL) 200 MG tablet Take 200 mg by mouth daily.    Historical Provider, MD  predniSONE (DELTASONE) 10 MG tablet Take 10 mg by mouth 2 (two) times daily with a meal.    Historical Provider, MD   BP 151/80 mmHg  Pulse 83  Temp(Src) 98.9 F (37.2 C) (Oral)  Resp 18  Ht 5\' 6"  (1.676 m)  Wt 144 lb 5 oz (65.46 kg)  BMI 23.30 kg/m2  SpO2 100%  LMP 07/11/2014 Physical Exam  Constitutional: She is oriented to person, place, and time. She appears well-developed and well-nourished.  Non-toxic appearance.  HENT:  Head: Normocephalic.  Right Ear: Tympanic membrane and external ear normal.  Left Ear: Tympanic membrane and external ear normal.  There is pain and redness  of the external septum. This swelling and redness involves the philtrum. There are at least 2 areas of drainage at the anterior septum one of them is more involved with the mucosa of the septum. The turbinates are swollen. There is increased mucous and drainage in the nares right and left. There is swelling of the mucosa of the upper lip. There is tenderness at the right lower jaw area, but no redness and no abscess appreciated there. There are a few submental lymph nodes appreciated.  Eyes: EOM and lids are normal. Pupils are equal, round, and reactive to light.  Neck: Normal range of motion. Neck supple. Carotid bruit is not present.  Cardiovascular: Normal rate,  regular rhythm, normal heart sounds, intact distal pulses and normal pulses.   Pulmonary/Chest: Breath sounds normal. No respiratory distress.  Abdominal: Soft. Bowel sounds are normal. There is no tenderness. There is no guarding.  Musculoskeletal: Normal range of motion.  Lymphadenopathy:       Head (right side): No submandibular adenopathy present.       Head (left side): No submandibular adenopathy present.    She has no cervical adenopathy.  Neurological: She is alert and oriented to person, place, and time. She has normal strength. No cranial nerve deficit or sensory deficit.  Skin: Skin is warm and dry.  Psychiatric: She has a normal mood and affect. Her speech is normal.  Patient is anxious, tearful and crying.  Nursing note and vitals reviewed.   ED Course  Pt seen with me by Dr Wilkie AyeHorton.  Procedures (including critical care time) Labs Review Labs Reviewed  CBC WITH DIFFERENTIAL/PLATELET  BASIC METABOLIC PANEL  POC URINE PREG, ED    Imaging Review No results found.   EKG Interpretation None      MDM  Vital signs are well within normal limits. The basic metabolic panel is well within normal limits.  Complete blood count is nonacute. CT of the maxillofacial area with contrast reveals soft tissue swelling along the right upper lip with mild diffuse soft tissue edema of the philtrum and mild soft tissue swelling overlying the right lateral mandible, no abscess appreciated. No fracture or dislocation appreciated.  The patient was treated in the emergency department with intravenous clindamycin. Patient was also given 2 doses of Dilaudid for her pain and discomfort. Patient tolerated the IV antibiotic without problem.  Discussed the findings on the physical exam as well as the CT scan with the patient in terms which he understood. A culture of the drainage from the nose was sent to the lab. Prescription for doxycycline twice a day and Norco given to the patient. The patient  is to be rechecked in 3 days.    Final diagnoses:  None    *I have reviewed nursing notes, vital signs, and all appropriate lab and imaging results for this patient.[address]**    Maleka Contino, PA-C 07/23/14 1910  Shon Batonourtney F Horton, MD 07/24/14 2258

## 2014-07-23 LAB — BASIC METABOLIC PANEL
ANION GAP: 6 (ref 5–15)
BUN: 8 mg/dL (ref 6–20)
CO2: 28 mmol/L (ref 22–32)
Calcium: 8.6 mg/dL — ABNORMAL LOW (ref 8.9–10.3)
Chloride: 104 mmol/L (ref 101–111)
Creatinine, Ser: 0.66 mg/dL (ref 0.44–1.00)
GFR calc Af Amer: >60 mL/min (ref >60)
GFR calc non Af Amer: >60 mL/min (ref >60)
Glucose, Bld: 74 mg/dL (ref 65–99)
Potassium: 3.7 mmol/L (ref 3.5–5.1)
Sodium: 138 mmol/L (ref 135–145)

## 2014-07-23 LAB — CBC WITH DIFFERENTIAL/PLATELET
Basophils Absolute: 0 10*3/uL (ref 0.0–0.1)
Basophils Relative: 0 % (ref 0–1)
EOS PCT: 0 % (ref 0–5)
Eosinophils Absolute: 0 10*3/uL (ref 0.0–0.7)
HCT: 35.6 % — ABNORMAL LOW (ref 36.0–46.0)
Hemoglobin: 11.5 g/dL — ABNORMAL LOW (ref 12.0–15.0)
LYMPHS ABS: 1.2 10*3/uL (ref 0.7–4.0)
Lymphocytes Relative: 16 % (ref 12–46)
MCH: 28.5 pg (ref 26.0–34.0)
MCHC: 32.3 g/dL (ref 30.0–36.0)
MCV: 88.3 fL (ref 78.0–100.0)
MONOS PCT: 8 % (ref 3–12)
Monocytes Absolute: 0.7 10*3/uL (ref 0.1–1.0)
Neutro Abs: 5.9 10*3/uL (ref 1.7–7.7)
Neutrophils Relative %: 76 % (ref 43–77)
Platelets: 279 10*3/uL (ref 150–400)
RBC: 4.03 MIL/uL (ref 3.87–5.11)
RDW: 13.8 % (ref 11.5–15.5)
WBC: 7.8 10*3/uL (ref 4.0–10.5)

## 2014-07-23 MED ORDER — DOXYCYCLINE HYCLATE 100 MG PO CAPS: 100.0000 mg | ORAL_CAPSULE | Freq: Two times a day (BID) | ORAL | Status: DC

## 2014-07-23 MED ORDER — IOHEXOL 300 MG/ML  SOLN
80.0000 mL | Freq: Once | INTRAMUSCULAR | Status: AC | PRN
  Administered 2014-07-23: 80 mL via INTRAVENOUS

## 2014-07-23 MED ORDER — CLINDAMYCIN PHOSPHATE 600 MG/50ML IV SOLN
600.0000 mg | Freq: Once | INTRAVENOUS | Status: AC
  Administered 2014-07-23: 600 mg via INTRAVENOUS
  Filled 2014-07-23: qty 50

## 2014-07-23 MED ORDER — HYDROMORPHONE HCL 1 MG/ML IJ SOLN
1.0000 mg | Freq: Once | INTRAMUSCULAR | Status: AC
  Administered 2014-07-23: 1 mg via INTRAVENOUS
  Filled 2014-07-23: qty 1

## 2014-07-23 MED ORDER — HYDROCODONE-ACETAMINOPHEN 7.5-325 MG PO TABS: 1.0000 | ORAL_TABLET | ORAL | Status: DC | PRN

## 2014-07-23 NOTE — ED Notes (Signed)
Hobson PA notified of pain level, additional orders given,

## 2014-07-23 NOTE — Discharge Instructions (Signed)
You have an infection with cellulitis involving her nose and upper lip and right face. Please apply warm compresses 3-4 times daily. Please use doxycycline 2 times daily with food. Use Norco and ibuprofen tablet every 4 hours as needed. Norco may cause drowsiness. [facility] or return to the ED if any changes or problem. Cellulitis Cellulitis is an infection of the skin and the tissue under the skin. The infected area is usually red and tender. This happens most often in the arms and lower legs. HOME CARE   Take your antibiotic medicine as told. Finish the medicine even if you start to feel better.  Keep the infected arm or leg raised (elevated).  Put a warm cloth on the area up to 4 times per day.  Only take medicines as told by your doctor.  Keep all doctor visits as told. GET HELP IF:  You see red streaks on the skin coming from the infected area.  Your red area gets bigger or turns a dark color.  Your bone or joint under the infected area is painful after the skin heals.  Your infection comes back in the same area or different area.  You have a puffy (swollen) bump in the infected area.  You have new symptoms.  You have a fever. GET HELP RIGHT AWAY IF:   You feel very sleepy.  You throw up (vomit) or have watery poop (diarrhea).  You feel sick and have muscle aches and pains. MAKE SURE YOU:   Understand these instructions.  Will watch your condition.  Will get help right away if you are not doing well or get worse. Document Released: 08/08/2007 Document Revised: 07/06/2013 Document Reviewed: 05/07/2011 [facility] Patient Information 2015 [employer]. This information is not intended to replace advice given to you by your health care provider. Make sure you discuss any questions you have with your health care provider.

## 2014-07-25 LAB — CULTURE, ROUTINE-ABSCESS
GRAM STAIN: NONE SEEN
Special Requests: NORMAL

## 2014-07-26 ENCOUNTER — Telehealth (HOSPITAL_COMMUNITY): Payer: Self-pay

## 2014-07-26 NOTE — ED Notes (Signed)
Patient called about changing the doxycycline RX to a cheaper Rx. Patient states she had 1/2 of Rx filled due to cost. EDP consulted today and states that the only alternative Rx is just as expensive and not to change original Rx. [facility] RN called patient and left message for her to call back.

## 2014-07-26 NOTE — Telephone Encounter (Signed)
Spoke with pt. Informed of lab results. 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 Pt states its looking better.

## 2014-07-28 ENCOUNTER — Telehealth: Payer: Self-pay | Admitting: *Deleted

## 2014-07-28 NOTE — ED Notes (Signed)
(+)  urine culture, treated with Doxycycline, OK per Peter MiniumJ Frens, Pharm

## 2015-02-05 ENCOUNTER — Encounter (HOSPITAL_COMMUNITY): Payer: Self-pay | Admitting: Emergency Medicine

## 2015-02-05 ENCOUNTER — Emergency Department (HOSPITAL_COMMUNITY)
Admission: EM | Admit: 2015-02-05 | Discharge: 2015-02-05 | Disposition: A | Discharge status: Home / Self Care | Payer: Non-veteran care | Attending: Emergency Medicine | Admitting: Emergency Medicine

## 2015-02-05 DIAGNOSIS — T2131XA Burn of third degree of chest wall, initial encounter: Secondary | ICD-10-CM | POA: Insufficient documentation

## 2015-02-05 DIAGNOSIS — T20311A Burn of third degree of right ear [any part, except ear drum], initial encounter: Secondary | ICD-10-CM | POA: Insufficient documentation

## 2015-02-05 DIAGNOSIS — Z7952 Long term (current) use of systemic steroids: Secondary | ICD-10-CM | POA: Diagnosis not present

## 2015-02-05 DIAGNOSIS — Z87891 Personal history of nicotine dependence: Secondary | ICD-10-CM | POA: Insufficient documentation

## 2015-02-05 DIAGNOSIS — Y9289 Other specified places as the place of occurrence of the external cause: Secondary | ICD-10-CM | POA: Insufficient documentation

## 2015-02-05 DIAGNOSIS — T22331A Burn of third degree of right upper arm, initial encounter: Secondary | ICD-10-CM | POA: Diagnosis not present

## 2015-02-05 DIAGNOSIS — Y998 Other external cause status: Secondary | ICD-10-CM | POA: Insufficient documentation

## 2015-02-05 DIAGNOSIS — T22351A Burn of third degree of right shoulder, initial encounter: Secondary | ICD-10-CM | POA: Diagnosis not present

## 2015-02-05 DIAGNOSIS — T2037XA Burn of third degree of neck, initial encounter: Secondary | ICD-10-CM | POA: Insufficient documentation

## 2015-02-05 DIAGNOSIS — S199XXA Unspecified injury of neck, initial encounter: Secondary | ICD-10-CM | POA: Diagnosis present

## 2015-02-05 DIAGNOSIS — Z23 Encounter for immunization: Secondary | ICD-10-CM | POA: Diagnosis not present

## 2015-02-05 DIAGNOSIS — T300 Burn of unspecified body region, unspecified degree: Secondary | ICD-10-CM

## 2015-02-05 DIAGNOSIS — Y9389 Activity, other specified: Secondary | ICD-10-CM | POA: Diagnosis not present

## 2015-02-05 LAB — BASIC METABOLIC PANEL
Anion gap: 10 (ref 5–15)
BUN: 10 mg/dL (ref 6–20)
CO2: 23 mmol/L (ref 22–32)
CREATININE: 0.57 mg/dL (ref 0.44–1.00)
Calcium: 8.7 mg/dL — ABNORMAL LOW (ref 8.9–10.3)
Chloride: 106 mmol/L (ref 101–111)
GFR calc Af Amer: >60 mL/min (ref >60)
Glucose, Bld: 78 mg/dL (ref 65–99)
Potassium: 3.6 mmol/L (ref 3.5–5.1)
SODIUM: 139 mmol/L (ref 135–145)

## 2015-02-05 LAB — CBC WITH DIFFERENTIAL/PLATELET
BASOS ABS: 0 10*3/uL (ref 0.0–0.1)
BASOS PCT: 0 %
EOS ABS: 0 10*3/uL (ref 0.0–0.7)
Eosinophils Relative: 0 %
HCT: 32.6 % — ABNORMAL LOW (ref 36.0–46.0)
Hemoglobin: 10.7 g/dL — ABNORMAL LOW (ref 12.0–15.0)
Lymphocytes Relative: 21 %
Lymphs Abs: 2.2 10*3/uL (ref 0.7–4.0)
MCH: 29.2 pg (ref 26.0–34.0)
MCHC: 32.8 g/dL (ref 30.0–36.0)
MCV: 88.8 fL (ref 78.0–100.0)
MONO ABS: 0.9 10*3/uL (ref 0.1–1.0)
Monocytes Relative: 8 %
Neutro Abs: 7.5 10*3/uL (ref 1.7–7.7)
Neutrophils Relative %: 71 %
Platelets: 355 10*3/uL (ref 150–400)
RBC: 3.67 MIL/uL — ABNORMAL LOW (ref 3.87–5.11)
RDW: 15.5 % (ref 11.5–15.5)
WBC: 10.7 10*3/uL — ABNORMAL HIGH (ref 4.0–10.5)

## 2015-02-05 LAB — ETHANOL: ALCOHOL ETHYL (B): 159 mg/dL — AB (ref <5)

## 2015-02-05 MED ORDER — TETANUS-DIPHTH-ACELL PERTUSSIS 5-2.5-18.5 LF-MCG/0.5 IM SUSP
0.5000 mL | Freq: Once | INTRAMUSCULAR | Status: AC
  Administered 2015-02-05: 0.5 mL via INTRAMUSCULAR
  Filled 2015-02-05: qty 0.5

## 2015-02-05 MED ORDER — SULFAMETHOXAZOLE-TRIMETHOPRIM 800-160 MG PO TABS: 1.0000 | ORAL_TABLET | Freq: Two times a day (BID) | ORAL | Status: AC

## 2015-02-05 MED ORDER — SILVER SULFADIAZINE 1 % EX CREA: 1.0000 "application " | TOPICAL_CREAM | Freq: Every day | CUTANEOUS | Status: DC

## 2015-02-05 MED ORDER — OXYCODONE-ACETAMINOPHEN 5-325 MG PO TABS
1.0000 | ORAL_TABLET | Freq: Once | ORAL | Status: AC
  Administered 2015-02-05: 1 via ORAL

## 2015-02-05 MED ORDER — OXYCODONE-ACETAMINOPHEN 5-325 MG PO TABS
ORAL_TABLET | ORAL | Status: AC
  Administered 2015-02-05: 1 via ORAL
  Filled 2015-02-05: qty 1

## 2015-02-05 MED ORDER — OXYCODONE-ACETAMINOPHEN 5-325 MG PO TABS: 1.0000 | ORAL_TABLET | ORAL | Status: DC | PRN

## 2015-02-05 MED ORDER — SULFAMETHOXAZOLE-TRIMETHOPRIM 800-160 MG PO TABS
1.0000 | ORAL_TABLET | Freq: Once | ORAL | Status: AC
  Administered 2015-02-05: 1 via ORAL
  Filled 2015-02-05: qty 1
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
## 2015-02-05 NOTE — ED Notes (Signed)
[employer] contacted per pt request.
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
## 2015-08-04 ENCOUNTER — Encounter (HOSPITAL_COMMUNITY): Payer: Self-pay | Admitting: Emergency Medicine

## 2015-08-04 ENCOUNTER — Emergency Department (HOSPITAL_COMMUNITY)
Admission: EM | Admit: 2015-08-04 | Discharge: 2015-08-04 | Disposition: A | Discharge status: Home / Self Care | Payer: Non-veteran care | Attending: Emergency Medicine | Admitting: Emergency Medicine

## 2015-08-04 DIAGNOSIS — F17210 Nicotine dependence, cigarettes, uncomplicated: Secondary | ICD-10-CM | POA: Insufficient documentation

## 2015-08-04 DIAGNOSIS — T7840XA Allergy, unspecified, initial encounter: Secondary | ICD-10-CM | POA: Diagnosis not present

## 2015-08-04 DIAGNOSIS — L299 Pruritus, unspecified: Secondary | ICD-10-CM | POA: Diagnosis present

## 2015-08-04 MED ORDER — FAMOTIDINE 20 MG PO TABS
20.0000 mg | ORAL_TABLET | Freq: Once | ORAL | Status: AC
  Administered 2015-08-04: 20 mg via ORAL
  Filled 2015-08-04: qty 1

## 2015-08-04 MED ORDER — HYDROXYZINE PAMOATE 25 MG PO CAPS: 25.0000 mg | ORAL_CAPSULE | Freq: Four times a day (QID) | ORAL | Status: DC | PRN

## 2015-08-04 MED ORDER — DEXAMETHASONE SODIUM PHOSPHATE 4 MG/ML IJ SOLN
8.0000 mg | Freq: Once | INTRAMUSCULAR | Status: AC
  Administered 2015-08-04: 8 mg via INTRAMUSCULAR
  Filled 2015-08-04: qty 2

## 2015-08-04 MED ORDER — HYDROXYZINE HCL 25 MG PO TABS
50.0000 mg | ORAL_TABLET | Freq: Once | ORAL | Status: AC
  Administered 2015-08-04: 50 mg via ORAL
  Filled 2015-08-04: qty 2

## 2015-08-04 NOTE — Discharge Instructions (Signed)
You were treated in the emergency department with intramuscular steroids on. Please use Vistaril every 6 hours as needed for itching, and/or swelling. Please see your primary physician, or return to the emergency department if the swelling continues. Vistaril may cause drowsiness, please do not drive, operate machinery, or participated in activities requiring concentration when taking this medication. Allergies An allergy is when your body reacts to a substance in a way that is not normal. An allergic reaction can happen after you:  Eat something.  Breathe in something.  Touch something. WHAT KINDS OF ALLERGIES ARE THERE? You can be allergic to:  Things that are only around during certain seasons, like molds and pollens.  Foods.  Drugs.  Insects.  Animal dander. WHAT ARE SYMPTOMS OF ALLERGIES?  Puffiness (swelling). This may happen on the lips, face, tongue, mouth, or throat.  Sneezing.  Coughing.  Breathing loudly (wheezing).  Stuffy nose.  Tingling in the mouth.  A rash.  Itching.  Itchy, red, puffy areas of skin (hives).  Watery eyes.  Throwing up (vomiting).  Watery poop (diarrhea).  Dizziness.  Feeling faint or fainting.  Trouble breathing or swallowing.  A tight feeling in the chest.  A fast heartbeat. HOW ARE ALLERGIES DIAGNOSED? Allergies can be diagnosed with:  A medical and family history.  Skin tests.  Blood tests.  A food diary. A food diary is a record of all the foods, drinks, and symptoms you have each day.  The results of an elimination diet. This diet involves making sure not to eat certain foods and then seeing what happens when you start eating them again. HOW ARE ALLERGIES TREATED? There is no cure for allergies, but allergic reactions can be treated with medicine. Severe reactions usually need to be treated at a hospital.  HOW CAN REACTIONS BE PREVENTED? The best way to prevent an allergic reaction is to avoid the thing you  are allergic to. Allergy shots and medicines can also help prevent reactions in some cases.   This information is not intended to replace advice given to you by your health care provider. Make sure you discuss any questions you have with your health care provider.   Document Released: 06/16/2012 Document Revised: 03/12/2014 Document Reviewed: 12/01/2013 Elsevier Interactive Patient Education [employer]2016 [employer].

## 2015-08-04 NOTE — ED Notes (Signed)
PT c/o itching to face and arm without rash. PT states she took benadryl yesterday and states no relief. PT denies any new medications or food.

## 2015-08-04 NOTE — ED Notes (Signed)
Pt made aware to return if symptoms worsen or if any life threatening symptoms occur.   

## 2015-08-04 NOTE — ED Provider Notes (Signed)
CSN: [id number]     Arrival date & time 08/04/15  1237 History   First MD Initiated Contact with Patient 08/04/15 1301     Chief Complaint  Patient presents with  . Pruritis     (Consider location/radiation/quality/duration/timing/severity/associated sxs/prior Treatment) HPI Comments: Patient is a 40 year old female who presents to the emergency department with a complaint of itching and swelling of her face.  The patient states that on yesterday she developed an itching about her face, without a rash, and swelling of her eyelids and her lips. She denies any loss of consciousness. There was no difficulty with breathing, or difficulty with swallowing. She later had some itching involving her upper extremities. The patient states that she took Benadryl and had only minimal to no relief from that. She was gradually able to go to sleep, and she awakened this morning to find that the swelling had improved, but not completely resolved. She presents to the emergency department for additional evaluation. She states she had someone to watch her on last evening to make sure that she did not have any acute changes as far as her swelling was concerned.  The history is provided by the patient.    Past Medical History  Diagnosis Date  . 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 Cholecystectomy    . Cesarean section     Family History  Problem Relation Age of Onset  . Lung cancer Mother   . Aneurysm Father     Brain   Social History  Substance Use Topics  . Smoking status: Current Every Day Smoker -- 0.50 packs/day    Types: Cigarettes  . Smokeless tobacco: Never Used  . Alcohol Use: Yes     Comment: stopped with pregnancy   OB History    Gravida Para Term Preterm AB TAB SAB Ectopic Multiple Living   4 2 2  1 1    2      Review of Systems  Constitutional: Negative for fever and chills.  HENT:  Positive for facial swelling. Negative for congestion and trouble swallowing.   Skin: Negative for rash.  All other systems reviewed and are negative.     Allergies  Review of patient's allergies indicates no known allergies.  Home Medications   Prior to Admission medications   Medication Sig Start Date End Date Taking? Authorizing Provider  clindamycin (CLEOCIN) 150 MG capsule Take 3 capsules (450 mg total) by mouth 3 (three) times daily. For full 10 days Patient not taking: Reported on 02/05/2015 01/18/14   Toy CookeyMegan Docherty, MD  doxycycline (VIBRAMYCIN) 100 MG capsule Take 1 capsule (100 mg total) by mouth 2 (two) times daily. Patient not taking: Reported on 02/05/2015 07/23/14   Ivery QualeHobson Shalie Schremp, PA-C  HYDROcodone-acetaminophen (NORCO) 7.5-325 MG per tablet Take 1 tablet by mouth every 4 (four) hours as needed. Patient not taking: Reported on 02/05/2015 07/23/14   Ivery QualeHobson Stone Spirito, PA-C  hydrOXYzine (VISTARIL) 25 MG capsule Take 1 capsule (25 mg total) by mouth every 6 (six) hours as needed for itching. Use as needed for itching and swelling. 08/04/15   Ivery QualeHobson Deneisha Dade, PA-C  oxyCODONE-acetaminophen (PERCOCET) 5-325 MG tablet Take 1-2 tablets by mouth every 4 (four) hours as needed. 02/05/15   Donnetta HutchingBrian Cook, MD  predniSONE (DELTASONE) 10 MG tablet Take 10 mg by mouth daily with breakfast.     Historical Provider, MD  silver sulfADIAZINE (SILVADENE) 1 % cream Apply 1 application  topically daily. 02/05/15   Donnetta Hutching, MD   BP 124/91 mmHg  Pulse 100  Temp(Src) 99.5 F (37.5 C) (Oral)  Resp 18  Ht  (1.676 m)  Wt 63.504 kg  BMI 22.61 kg/m2  SpO2 99%  LMP 07/14/2015 Physical Exam  Constitutional: She is oriented to person, place, and time. She appears well-developed and well-nourished.  Non-toxic appearance.  HENT:  Head: Normocephalic.  Right Ear: Tympanic membrane and external ear normal.  Left Ear: Tympanic membrane and external ear normal.  There is mild swelling of the upper and lower lip.  There is no swelling of the tongue. The airway is patent. The speech is understandable.  Eyes: EOM and lids are normal. Pupils are equal, round, and reactive to light.  There is mild increased redness around the eyes, no swelling at this time. Conjunctiva is clear.  Neck: Normal range of motion. Neck supple. Carotid bruit is not present.  Cardiovascular: Normal rate, regular rhythm, normal heart sounds, intact distal pulses and normal pulses.   Pulmonary/Chest: Breath sounds normal. No respiratory distress. She has no wheezes. She has no rales.  Abdominal: Soft. Bowel sounds are normal. There is no tenderness. There is no guarding.  Musculoskeletal: Normal range of motion. She exhibits no edema or tenderness.  Lymphadenopathy:       Head (right side): No submandibular adenopathy present.       Head (left side): No submandibular adenopathy present.    She has no cervical adenopathy.  Neurological: She is alert and oriented to person, place, and time. She has normal strength. No cranial nerve deficit or sensory deficit.  Skin: Skin is warm and dry. No rash noted.  Psychiatric: Her speech is normal. Her mood appears anxious.  Nursing note and vitals reviewed.   ED Course  Procedures (including critical care time) Labs Review Labs Reviewed - No data to display  Imaging Review No results found. I have personally reviewed and evaluated these images and lab results as part of my medical decision-making.   EKG Interpretation None      MDM  Patient states that she had swelling of the lips and around the eyes and face on yesterday. After Benadryl today she has some residual swelling about the lips, no other problems appreciated. The patient speaks in complete sentences. There is no wheezes or rales appreciated. There is symmetrical rise and fall of the chest, I find no evidence of any anaphylactic reaction at this time.  The patient is treated in the emergency department with Decadron.  Prescription for Vistaril given to the patient. The patient is encouraged to see the allergy specialist here in [place] if not improving. The patient is to return to the emergency department immediately if any changes, problems, or concerns.    Final diagnoses:  Allergic reaction, initial encounter    *I have reviewed nursing notes, vital signs, and all appropriate lab and imaging results for this patient.[address] PA-C 08/04/15 1402  Eber Hong, MD 08/05/15 [phone]

## 2015-09-18 ENCOUNTER — Encounter (HOSPITAL_COMMUNITY): Payer: Self-pay | Admitting: Emergency Medicine

## 2015-09-18 ENCOUNTER — Emergency Department (HOSPITAL_COMMUNITY)
Admission: EM | Admit: 2015-09-18 | Discharge: 2015-09-18 | Disposition: A | Discharge status: Home / Self Care | Payer: Non-veteran care | Attending: Emergency Medicine | Admitting: Emergency Medicine

## 2015-09-18 DIAGNOSIS — L03116 Cellulitis of left lower limb: Secondary | ICD-10-CM | POA: Diagnosis not present

## 2015-09-18 DIAGNOSIS — F17210 Nicotine dependence, cigarettes, uncomplicated: Secondary | ICD-10-CM | POA: Diagnosis not present

## 2015-09-18 DIAGNOSIS — M79672 Pain in left foot: Secondary | ICD-10-CM | POA: Diagnosis present

## 2015-09-18 DIAGNOSIS — L02612 Cutaneous abscess of left foot: Secondary | ICD-10-CM | POA: Diagnosis not present

## 2015-09-18 DIAGNOSIS — Z79899 Other long term (current) drug therapy: Secondary | ICD-10-CM | POA: Insufficient documentation

## 2015-09-18 MED ORDER — SULFAMETHOXAZOLE-TRIMETHOPRIM 800-160 MG PO TABS: 1.0000 | ORAL_TABLET | Freq: Two times a day (BID) | ORAL | Status: AC

## 2015-09-18 MED ORDER — OXYCODONE-ACETAMINOPHEN 5-325 MG PO TABS
1.0000 | ORAL_TABLET | Freq: Once | ORAL | Status: AC
  Administered 2015-09-18: 1 via ORAL
  Filled 2015-09-18: qty 1

## 2015-09-18 MED ORDER — OXYCODONE-ACETAMINOPHEN 5-325 MG PO TABS: 1.0000 | ORAL_TABLET | ORAL | Status: DC | PRN

## 2015-09-18 MED ORDER — LIDOCAINE-EPINEPHRINE (PF) 2 %-1:200000 IJ SOLN
10.0000 mL | Freq: Once | INTRAMUSCULAR | Status: AC
  Administered 2015-09-18: 10 mL
  Filled 2015-09-18: qty 20

## 2015-09-18 MED ORDER — SULFAMETHOXAZOLE-TRIMETHOPRIM 800-160 MG PO TABS
1.0000 | ORAL_TABLET | Freq: Once | ORAL | Status: AC
  Administered 2015-09-18: 1 via ORAL
  Filled 2015-09-18: qty 1

## 2015-09-18 NOTE — ED Provider Notes (Signed)
CSN: [id number]     Arrival date & time 09/18/15  1143 History   First MD Initiated Contact with Patient 09/18/15 1155     Chief Complaint  Patient presents with  . Wound Infection   Pt is a 40 yo BF who presents today with an infection of her left foot.  Pt said she sustained a wound to her foot, then it developed a blister, then redness and pain.  The patient said that she has redness traveling up her left foot.   (Consider location/radiation/quality/duration/timing/severity/associated sxs/prior Treatment) The history is provided by the patient.    Past Medical History  Diagnosis Date  . 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 Cholecystectomy    . Cesarean section     Family History  Problem Relation Age of Onset  . Lung cancer Mother   . Aneurysm Father     Brain   Social History  Substance Use Topics  . Smoking status: Current Every Day Smoker -- 0.50 packs/day    Types: Cigarettes  . Smokeless tobacco: Never Used  . Alcohol Use: Yes     Comment: stopped with pregnancy   OB History    Gravida Para Term Preterm AB TAB SAB Ectopic Multiple Living   4 2 2  1 1    2      Review of Systems  Skin: Positive for rash.  All other systems reviewed and are negative.     Allergies  Review of patient's allergies indicates no known allergies.  Home Medications   Prior to Admission medications   Medication Sig Start Date End Date Taking? Authorizing Provider  predniSONE (DELTASONE) 10 MG tablet Take 10 mg by mouth daily with breakfast.    Yes Historical Provider, MD  hydrOXYzine (VISTARIL) 25 MG capsule Take 1 capsule (25 mg total) by mouth every 6 (six) hours as needed for itching. Use as needed for itching and swelling. Patient not taking: Reported on 09/18/2015 08/04/15   Ivery QualeHobson Bryant, PA-C  oxyCODONE-acetaminophen (PERCOCET/ROXICET) 5-325 MG tablet Take 1 tablet by mouth  every 4 (four) hours as needed for severe pain. 09/18/15   Jacalyn LefevreJulie Lilton Pare, MD  sulfamethoxazole-trimethoprim (BACTRIM DS,SEPTRA DS) 800-160 MG tablet Take 1 tablet by mouth 2 (two) times daily. 09/18/15 09/25/15  Jacalyn LefevreJulie Majestic Brister, MD   BP 122/81 mmHg  Pulse 102  Temp(Src) 99.1 F (37.3 C) (Oral)  Resp 16  Ht 5\' 6"  (1.676 m)  Wt 140 lb (63.504 kg)  BMI 22.61 kg/m2  SpO2 100%  LMP 09/14/2015 Physical Exam  Constitutional: She is oriented to person, place, and time. She appears well-developed and well-nourished.  HENT:  Head: Normocephalic and atraumatic.  Right Ear: External ear normal.  Left Ear: External ear normal.  Nose: Nose normal.  Mouth/Throat: Oropharynx is clear and moist.  Eyes: Conjunctivae and EOM are normal. Pupils are equal, round, and reactive to light.  Neck: Normal range of motion. Neck supple.  Cardiovascular: Regular rhythm, normal heart sounds and intact distal pulses.  Tachycardia present.   Pulmonary/Chest: Effort normal and breath sounds normal.  Abdominal: Soft. Bowel sounds are normal.  Musculoskeletal: Normal range of motion.  Neurological: She is alert and oriented to person, place, and time.  Skin: Rash noted.  Large abscess to ball of foot with surrounding cellulitis to top and bottom of foot.  Psychiatric: She has a normal mood and affect. Her behavior is normal. Judgment and thought content  normal.  Nursing note and vitals reviewed.   ED Course  .Marland KitchenIncision and Drainage Date/Time: 09/18/2015 1:10 PM Performed by: Jacalyn Lefevre Authorized by: Jacalyn Lefevre Consent: Verbal consent obtained. Risks and benefits: risks, benefits and alternatives were discussed Consent given by: patient Patient understanding: patient states understanding of the procedure being performed Patient consent: the patient's understanding of the procedure matches consent given Procedure consent: procedure consent matches procedure scheduled Relevant documents: relevant  documents present and verified Test results: test results available and properly labeled Site marked: the operative site was marked Imaging studies: imaging studies available Required items: required blood products, implants, devices, and special equipment available Patient identity confirmed: verbally with patient Time out: Immediately prior to procedure a "time out" was called to verify the correct patient, procedure, equipment, support staff and site/side marked as required. Type: abscess Body area: lower extremity Location details: left foot Anesthesia: local infiltration Local anesthetic: lidocaine 2% with epinephrine Anesthetic total: 3 ml Patient sedated: no Scalpel size: 11 Incision type: elliptical Incision depth: dermal Complexity: simple Drainage: purulent Drainage amount: copious Wound treatment: wound left open Patient tolerance: Patient tolerated the procedure well with no immediate complications   (including critical care time) Labs Review Labs Reviewed - No data to display  Imaging Review No results found. I have personally reviewed and evaluated these images and lab results as part of my medical decision-making.   EKG Interpretation None      MDM  Pt will be placed on bactrim and percocet.  Pt knows to return if worse.  Final diagnoses:  Abscess of left foot  Cellulitis of left foot       Jacalyn Lefevre, MD 09/18/15 1311

## 2015-09-18 NOTE — Discharge Instructions (Signed)
Cellulitis °Cellulitis is an infection of the skin and the tissue under the skin. The infected area is usually red and tender. This happens most often in the arms and lower legs. °HOME CARE  °· Take your antibiotic medicine as told. Finish the medicine even if you start to feel better. °· Keep the infected arm or leg raised (elevated). °· Put a warm cloth on the area up to 4 times per day. °· Only take medicines as told by your doctor. °· Keep all doctor visits as told. °GET HELP IF: °· You see red streaks on the skin coming from the infected area. °· Your red area gets bigger or turns a dark color. °· Your bone or joint under the infected area is painful after the skin heals. °· Your infection comes back in the same area or different area. °· You have a puffy (swollen) bump in the infected area. °· You have new symptoms. °· You have a fever. °GET HELP RIGHT AWAY IF:  °· You feel very sleepy. °· You throw up (vomit) or have watery poop (diarrhea). °· You feel sick and have muscle aches and pains. °  °This information is not intended to replace advice given to you by your health care provider. Make sure you discuss any questions you have with your health care provider. °  °Document Released: 08/08/2007 Document Revised: 11/10/2014 Document Reviewed: 05/07/2011 °Elsevier Interactive Patient Education ©2016 [employer] ° °Abscess °An abscess (boil or furuncle) is an infected area on or under the skin. This area is filled with yellowish-white fluid (pus) and other material (debris). °HOME CARE  °· Only take medicines as told by your doctor. °· If you were given antibiotic medicine, take it as directed. Finish the medicine even if you start to feel better. °· If gauze is used, follow your doctor's directions for changing the gauze. °· To avoid spreading the infection: °¨ Keep your abscess covered with a bandage. °¨ Wash your hands well. °¨ Do not share personal care items, towels, or whirlpools with others. °¨ Avoid  skin contact with others. °· Keep your skin and clothes clean around the abscess. °· Keep all doctor visits as told. °GET HELP RIGHT AWAY IF:  °· You have more pain, puffiness (swelling), or redness in the wound site. °· You have more fluid or blood coming from the wound site. °· You have muscle aches, chills, or you feel sick. °· You have a fever. °MAKE SURE YOU:  °· Understand these instructions. °· Will watch your condition. °· Will get help right away if you are not doing well or get worse. °  °This information is not intended to replace advice given to you by your health care provider. Make sure you discuss any questions you have with your health care provider. °  °Document Released: 08/08/2007 Document Revised: 08/21/2011 Document Reviewed: 05/05/2011 °Elsevier Interactive Patient Education ©2016 [employer] ° °

## 2015-09-18 NOTE — ED Notes (Signed)
Patient c/o possible infection to ball of left foot. Per patient appeared 3 days ago, which she states "looked like a blister." Area progressively increased in size and pain despite soaking foot in [place] salt. Denies any drainage or fevers. Large pustulae filled blister to ball of foot noted.

## 2016-02-20 ENCOUNTER — Other Ambulatory Visit: Payer: Self-pay | Admitting: Obstetrics and Gynecology

## 2016-02-20 DIAGNOSIS — O3680X0 Pregnancy with inconclusive fetal viability, not applicable or unspecified: Secondary | ICD-10-CM

## 2016-02-22 ENCOUNTER — Other Ambulatory Visit: Payer: Self-pay

## 2016-02-23 ENCOUNTER — Other Ambulatory Visit: Payer: Self-pay

## 2016-03-01 ENCOUNTER — Ambulatory Visit (INDEPENDENT_AMBULATORY_CARE_PROVIDER_SITE_OTHER): Payer: Medicaid Other

## 2016-03-01 DIAGNOSIS — O3680X0 Pregnancy with inconclusive fetal viability, not applicable or unspecified: Secondary | ICD-10-CM

## 2016-03-01 DIAGNOSIS — Z3A11 11 weeks gestation of pregnancy: Secondary | ICD-10-CM

## 2016-03-01 NOTE — Progress Notes (Addendum)
US 10+4 wks,single IUP w/ys,pos fht 153 bpm,normal ov's bilat,crl 32.9 mm,EDD 09/23/2016 by LMP

## 2016-03-12 ENCOUNTER — Encounter: Payer: Self-pay | Admitting: Women's Health

## 2016-03-22 ENCOUNTER — Encounter: Payer: Self-pay | Admitting: Advanced Practice Midwife

## 2016-04-12 ENCOUNTER — Encounter: Payer: Self-pay | Admitting: Women's Health

## 2016-04-12 ENCOUNTER — Ambulatory Visit (INDEPENDENT_AMBULATORY_CARE_PROVIDER_SITE_OTHER): Payer: Medicaid Other | Admitting: Women's Health

## 2016-04-12 VITALS — BP 126/62 | HR 96 | Wt 148.0 lb

## 2016-04-12 DIAGNOSIS — F17200 Nicotine dependence, unspecified, uncomplicated: Secondary | ICD-10-CM | POA: Diagnosis not present

## 2016-04-12 DIAGNOSIS — Z3482 Encounter for supervision of other normal pregnancy, second trimester: Secondary | ICD-10-CM

## 2016-04-12 DIAGNOSIS — O26852 Spotting complicating pregnancy, second trimester: Secondary | ICD-10-CM

## 2016-04-12 DIAGNOSIS — O09522 Supervision of elderly multigravida, second trimester: Secondary | ICD-10-CM

## 2016-04-12 DIAGNOSIS — O09529 Supervision of elderly multigravida, unspecified trimester: Secondary | ICD-10-CM | POA: Insufficient documentation

## 2016-04-12 DIAGNOSIS — Z3682 Encounter for antenatal screening for nuchal translucency: Secondary | ICD-10-CM

## 2016-04-12 DIAGNOSIS — Z331 Pregnant state, incidental: Secondary | ICD-10-CM | POA: Diagnosis not present

## 2016-04-12 DIAGNOSIS — Z1389 Encounter for screening for other disorder: Secondary | ICD-10-CM | POA: Diagnosis not present

## 2016-04-12 DIAGNOSIS — O09219 Supervision of pregnancy with history of pre-term labor, unspecified trimester: Secondary | ICD-10-CM | POA: Diagnosis not present

## 2016-04-12 DIAGNOSIS — O99342 Other mental disorders complicating pregnancy, second trimester: Secondary | ICD-10-CM | POA: Diagnosis not present

## 2016-04-12 DIAGNOSIS — O99332 Smoking (tobacco) complicating pregnancy, second trimester: Secondary | ICD-10-CM | POA: Diagnosis not present

## 2016-04-12 DIAGNOSIS — O0992 Supervision of high risk pregnancy, unspecified, second trimester: Secondary | ICD-10-CM

## 2016-04-12 DIAGNOSIS — Z363 Encounter for antenatal screening for malformations: Secondary | ICD-10-CM

## 2016-04-12 DIAGNOSIS — Z124 Encounter for screening for malignant neoplasm of cervix: Secondary | ICD-10-CM

## 2016-04-12 DIAGNOSIS — O09899 Supervision of other high risk pregnancies, unspecified trimester: Secondary | ICD-10-CM

## 2016-04-12 DIAGNOSIS — F329 Major depressive disorder, single episode, unspecified: Secondary | ICD-10-CM

## 2016-04-12 DIAGNOSIS — Z3A17 17 weeks gestation of pregnancy: Secondary | ICD-10-CM | POA: Diagnosis not present

## 2016-04-12 DIAGNOSIS — R760 Raised antibody titer: Secondary | ICD-10-CM | POA: Diagnosis not present

## 2016-04-12 DIAGNOSIS — F32A Depression, unspecified: Secondary | ICD-10-CM | POA: Insufficient documentation

## 2016-04-12 DIAGNOSIS — L930 Discoid lupus erythematosus: Secondary | ICD-10-CM

## 2016-04-12 DIAGNOSIS — B009 Herpesviral infection, unspecified: Secondary | ICD-10-CM

## 2016-04-12 DIAGNOSIS — Z8751 Personal history of pre-term labor: Secondary | ICD-10-CM | POA: Insufficient documentation

## 2016-04-12 DIAGNOSIS — O34219 Maternal care for unspecified type scar from previous cesarean delivery: Secondary | ICD-10-CM

## 2016-04-12 LAB — POCT WET PREP (WET MOUNT)
CLUE CELLS WET PREP WHIFF POC: NEGATIVE
TRICHOMONAS WET PREP HPF POC: ABSENT

## 2016-04-12 MED ORDER — CITRANATAL ASSURE 35-1 & 300 MG PO MISC: ORAL | 11 refills | Status: DC

## 2016-04-12 NOTE — Patient Instructions (Signed)
Monistat 7 for yeast  Begin taking a 81mg  baby aspirin daily to decrease risk of preeclampsia during pregnancy   Call if you decide to do the Makena shot to help prevent preterm birth  Nausea & Vomiting  Have saltine crackers or pretzels by your bed and eat a few bites before you raise your head out of bed in the morning  Eat small frequent meals throughout the day instead of large meals  Drink plenty of fluids throughout the day to stay hydrated, just don't drink a lot of fluids with your meals.  This can make your stomach fill up faster making you feel sick  Do not brush your teeth right after you eat  Products with real ginger are good for nausea, like ginger ale and ginger hard candy Make sure it says made with real ginger!  Sucking on sour candy like lemon heads is also good for nausea  If your prenatal vitamins make you nauseated, take them at night so you will sleep through the nausea  Sea Bands  If you feel like you need medicine for the nausea & vomiting please let us know  If you are unable to keep any fluids or food down please let us know   Constipation  Drink plenty of fluid, preferably water, throughout the day  Eat foods high in fiber such as fruits, vegetables, and grains  Exercise, such as walking, is a good way to keep your bowels regular  Drink warm fluids, especially warm prune juice, or decaf coffee  Eat a 1/2 cup of real oatmeal (not instant), 1/2 cup applesauce, and 1/2-1 cup warm prune juice every day  If needed, you may take Colace (docusate sodium) stool softener once or twice a day to help keep the stool soft. If you are pregnant, wait until you are out of your first trimester (12-14 weeks of pregnancy)  If you still are having problems with constipation, you may take Miralax once daily as needed to help keep your bowels regular.  If you are pregnant, wait until you are out of your first trimester (12-14 weeks of pregnancy)     Second Trimester  of Pregnancy The second trimester is from week 13 through week 28 (months 4 through 6). The second trimester is often a time when you feel your best. Your body has also adjusted to being pregnant, and you begin to feel better physically. Usually, morning sickness has lessened or quit completely, you may have more energy, and you may have an increase in appetite. The second trimester is also a time when the fetus is growing rapidly. At the end of the sixth month, the fetus is about 9 inches long and weighs about 1 pounds. You will likely begin to feel the baby move (quickening) between 18 and 20 weeks of the pregnancy. Body changes during your second trimester Your body continues to go through many changes during your second trimester. The changes vary from woman to woman.  Your weight will continue to increase. You will notice your lower abdomen bulging out.  You may begin to get stretch marks on your hips, abdomen, and breasts.  You may develop headaches that can be relieved by medicines. The medicines should be approved by your health care provider.  You may urinate more often because the fetus is pressing on your bladder.  You may develop or continue to have heartburn as a result of your pregnancy.  You may develop constipation because certain hormones are causing the muscles  that push waste through your intestines to slow down.  You may develop hemorrhoids or swollen, bulging veins (varicose veins).  You may have back pain. This is caused by:  Weight gain.  Pregnancy hormones that are relaxing the joints in your pelvis.  A shift in weight and the muscles that support your balance.  Your breasts will continue to grow and they will continue to become tender.  Your gums may bleed and may be sensitive to brushing and flossing.  Dark spots or blotches (chloasma, mask of pregnancy) may develop on your face. This will likely fade after the baby is born.  A dark line from your belly  button to the pubic area (linea nigra) may appear. This will likely fade after the baby is born.  You may have changes in your hair. These can include thickening of your hair, rapid growth, and changes in texture. Some women also have hair loss during or after pregnancy, or hair that feels dry or thin. Your hair will most likely return to normal after your baby is born. What to expect at prenatal visits During a routine prenatal visit:  You will be weighed to make sure you and the fetus are growing normally.  Your blood pressure will be taken.  Your abdomen will be measured to track your baby's growth.  The fetal heartbeat will be listened to.  Any test results from the previous visit will be discussed. Your health care provider may ask you:  How you are feeling.  If you are feeling the baby move.  If you have had any abnormal symptoms, such as leaking fluid, bleeding, severe headaches, or abdominal cramping.  If you are using any tobacco products, including cigarettes, chewing tobacco, and electronic cigarettes.  If you have any questions. Other tests that may be performed during your second trimester include:  Blood tests that check for:  Low iron levels (anemia).  Gestational diabetes (between 24 and 28 weeks).  Rh antibodies. This is to check for a protein on red blood cells (Rh factor).  Urine tests to check for infections, diabetes, or protein in the urine.  An ultrasound to confirm the proper growth and development of the baby.  An amniocentesis to check for possible genetic problems.  Fetal screens for spina bifida and Down syndrome.  HIV (human immunodeficiency virus) testing. Routine prenatal testing includes screening for HIV, unless you choose not to have this test. Follow these instructions at home: Eating and drinking  Continue to eat regular, healthy meals.  Avoid raw meat, uncooked cheese, cat litter boxes, and soil used by cats. These carry germs  that can cause birth defects in the baby.  Take your prenatal vitamins.  Take 1500-2000 mg of calcium daily starting at the 20th week of pregnancy until you deliver your baby.  If you develop constipation:  Take over-the-counter or prescription medicines.  Drink enough fluid to keep your urine clear or pale yellow.  Eat foods that are high in fiber, such as fresh fruits and vegetables, whole grains, and beans.  Limit foods that are high in fat and processed sugars, such as fried and sweet foods. Activity  Exercise only as directed by your health care provider. Experiencing uterine cramps is a good sign to stop exercising.  Avoid heavy lifting, wear low heel shoes, and practice good posture.  Wear your seat belt at all times when driving.  Rest with your legs elevated if you have leg cramps or low back pain.  Wear a  good support bra for breast tenderness.  Do not use hot tubs, steam rooms, or saunas. Lifestyle  Avoid all smoking, herbs, alcohol, and unprescribed drugs. These chemicals affect the formation and growth of the baby.  Do not use any products that contain nicotine or tobacco, such as cigarettes and e-cigarettes. If you need help quitting, ask your health care provider.  A sexual relationship may be continued unless your health care provider directs you otherwise. General instructions  Follow your health care provider's instructions regarding medicine use. There are medicines that are either safe or unsafe to take during pregnancy.  Take warm sitz baths to soothe any pain or discomfort caused by hemorrhoids. Use hemorrhoid cream if your health care provider approves.  If you develop varicose veins, wear support hose. Elevate your feet for 15 minutes, 3-4 times a day. Limit salt in your diet.  Visit your dentist if you have not gone yet during your pregnancy. Use a soft toothbrush to brush your teeth and be gentle when you floss.  Keep all follow-up prenatal  visits as told by your health care provider. This is important. Contact a health care provider if:  You have dizziness.  You have mild pelvic cramps, pelvic pressure, or nagging pain in the abdominal area.  You have persistent nausea, vomiting, or diarrhea.  You have a bad smelling vaginal discharge.  You have pain with urination. Get help right away if:  You have a fever.  You are leaking fluid from your vagina.  You have spotting or bleeding from your vagina.  You have severe abdominal cramping or pain.  You have rapid weight gain or weight loss.  You have shortness of breath with chest pain.  You notice sudden or extreme swelling of your face, hands, ankles, feet, or legs.  You have not felt your baby move in over an hour.  You have severe headaches that do not go away with medicine.  You have vision changes. Summary  The second trimester is from week 13 through week 28 (months 4 through 6). It is also a time when the fetus is growing rapidly.  Your body goes through many changes during pregnancy. The changes vary from woman to woman.  Avoid all smoking, herbs, alcohol, and unprescribed drugs. These chemicals affect the formation and growth your baby.  Do not use any tobacco products, such as cigarettes, chewing tobacco, and e-cigarettes. If you need help quitting, ask your health care provider.  Contact your health care provider if you have any questions. Keep all prenatal visits as told by your health care provider. This is important. This information is not intended to replace advice given to you by your health care provider. Make sure you discuss any questions you have with your health care provider. Document Released: 02/13/2001 Document Revised: 07/28/2015 Document Reviewed: 04/22/2012 Elsevier Interactive Patient Education  2017 [employer].

## 2016-04-12 NOTE — Progress Notes (Addendum)
Subjective:  Caroline Bishop is a 41 y.o. [phone] African American female at [redacted]w[redacted]d by LMP c/w 10wk u/s, being seen today for her first obstetrical visit.  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 Depression- no meds, sees counselor q month at [facility]. Denies SI/HI.  Pregnancy history fully reviewed.  Patient reports intermittent random spotting- none in few days. Denies vb, cramping, uti s/s, abnormal/malodorous vag d/c, or vulvovaginal itching/irritation.  BP 126/62   Pulse 96   Wt 148 lb (67.1 kg)   LMP 12/18/2015 (Approximate)   BMI 23.89 kg/m   HISTORY: OB History  Gravida Para Term Preterm AB Living  6 3 2 1 2 3   SAB TAB Ectopic Multiple Live Births  1 1     3     # Outcome Date GA Lbr Len/2nd Weight Sex Delivery Anes PTL Lv  6 Current           5 SAB 06/2013          4 Term 02/18/05 [redacted]w[redacted]d  7 lb 11 oz (3.487 kg) M Vag-Spont EPI N LIV     Birth Comments: gall bladder removed during pregnancy, fetal distres; born  [facility]  3 Preterm 03/30/98 [redacted]w[redacted]d  2 lb 6 oz (1.077 kg) F CS-Unspec  Y LIV  2 Term 07/18/92 [redacted]w[redacted]d  7 lb 13 oz (3.544 kg) F Vag-Spont EPI N LIV     Birth Comments: no complications, born [place]  [address] [place] [address]             Past Medical History:  Diagnosis Date  . Lupus anticoagulant complicating pregnancy in first trimester, antepartum [facility])    Past Surgical History:  Procedure Laterality Date  . CESAREAN SECTION    . CHOLECYSTECTOMY     Family History  Problem Relation Age of Onset  . Lung cancer Mother   . Aneurysm Father     Brain  . Asthma Brother   . Diabetes Maternal Grandmother   . Hypertension Maternal Grandmother     Exam   System:     General: Well  developed & nourished, no acute distress   Skin: Warm & dry, normal coloration and turgor, no rashes   Neurologic: Alert & oriented, normal mood   Cardiovascular: Regular rate & rhythm   Respiratory: Effort & rate normal, LCTAB, acyanotic   Abdomen: Soft, non tender   Extremities: normal strength, tone   Pelvic Exam:    Perineum: Normal perineum   Vulva: Normal, no lesions   Vagina:  Normal mucosa, thick clumpy nonodorous d/c, wet prep +yeast, no bleeding   Cervix: Normal, bulbous, appears closed   Uterus: Normal size/shape/contour for GA   Thin prep pap smear will defer for now d/t intermittent spotting  FHR: 144 via doppler  Depression screen [facility] 2/9 04/12/2016  Decreased Interest 1  Down, Depressed, Hopeless 1  PHQ - 2 Score 2  Altered sleeping 1  Tired, decreased energy 1  Change in appetite 0  Feeling bad or failure about yourself  0  Trouble concentrating 0  Moving slowly or fidgety/restless 0  Suicidal thoughts 0  PHQ-9 Score 4      Assessment:   Pregnancy: [id number] Patient Active  Problem List   Diagnosis Date Noted  . AMA (advanced maternal age) multigravida 35+ 04/12/2016  . 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 Lupus anticoagulant positive 06/11/2013  . H/O: C-section 06/11/2013  . Supervision of high-risk pregnancy 06/11/2013  . ANKLE SPRAIN 01/28/2008    4088w4d [id number] New OB visit AMA Lupus anticoagulant + HSV2  Prev c/s then successful vbac H/O 28wk ptb d/t pprom/labor Smoker Spotting during pregnancy, Rh+ Depression Vulvovaginal candida  Plan:  Discussed lupus w/ LHE- to get cmp and c3&c4 complement levels w/ labs today, take baby asa daily, ok to continue prednisone 5mg  BID Initial labs obtained, including cmp, c3&c4 complement levels Continue prenatal vitamins Problem list reviewed and updated Reviewed n/v relief measures and warning s/s to report Reviewed recommended weight gain based on pre-gravid BMI Encouraged well-balanced  diet Genetic Screening discussed Integrated Screen: too late for nt/it, requested AFP Cystic fibrosis screening discussed declined Ultrasound discussed; fetal survey: requested Follow up in 3 weeks for visit w/ LHE, anatomy u/s, pap smear (after placental location determined) CCNC completed Smokes 1/2pp/day, advised cessation, discussed risks to fetus while pregnant, to infant pp, and to herself. Offered QuitlineNC, declined Environmental education officerffered Makena d/t h/o PTB, declined, gave printed pamphlet to take home and review Suppress HSV @ 34wks Declined flu shot Offered VBAC, wants, gave consent to take home and review    OTC monistat 7 for yeast Pelvic rest until no vb for at least 7d  Marge DuncansBooker, Kimberly Randall CNM, [facility]NP-BC 04/12/2016 3:44 PM

## 2016-04-13 ENCOUNTER — Telehealth: Payer: Self-pay | Admitting: *Deleted

## 2016-04-13 ENCOUNTER — Encounter: Payer: Self-pay | Admitting: Women's Health

## 2016-04-13 DIAGNOSIS — F1910 Other psychoactive substance abuse, uncomplicated: Secondary | ICD-10-CM | POA: Insufficient documentation

## 2016-04-13 DIAGNOSIS — O99320 Drug use complicating pregnancy, unspecified trimester: Secondary | ICD-10-CM

## 2016-04-13 LAB — COMPREHENSIVE METABOLIC PANEL
ALBUMIN: 4.3 g/dL (ref 3.5–5.5)
ALK PHOS: 42 IU/L (ref 39–117)
ALT: 9 IU/L (ref 0–32)
AST: 18 IU/L (ref 0–40)
Albumin/Globulin Ratio: 1.2 (ref 1.2–2.2)
BUN / CREAT RATIO: 10 (ref 9–23)
BUN: 5 mg/dL — AB (ref 6–24)
Bilirubin Total: 0.3 mg/dL (ref 0.0–1.2)
CALCIUM: 9.7 mg/dL (ref 8.7–10.2)
CO2: 18 mmol/L (ref 18–29)
CREATININE: 0.5 mg/dL — AB (ref 0.57–1.00)
Chloride: 98 mmol/L (ref 96–106)
GFR, EST AFRICAN AMERICAN: 138 mL/min/{1.73_m2} (ref >59)
GFR, EST NON AFRICAN AMERICAN: 120 mL/min/{1.73_m2} (ref >59)
GLUCOSE: 77 mg/dL (ref 65–99)
Globulin, Total: 3.5 g/dL (ref 1.5–4.5)
Potassium: 4.3 mmol/L (ref 3.5–5.2)
Sodium: 136 mmol/L (ref 134–144)
TOTAL PROTEIN: 7.8 g/dL (ref 6.0–8.5)

## 2016-04-13 LAB — C3 AND C4
COMPLEMENT C3, SERUM: 168 mg/dL — AB (ref 82–167)
Complement C4, Serum: 43 mg/dL (ref 14–44)

## 2016-04-13 NOTE — Telephone Encounter (Signed)
Spoke with patient who stated she was now able to get on mychart. No other questions.

## 2016-04-18 LAB — CBC
Hematocrit: 32.1 % — ABNORMAL LOW (ref 34.0–46.6)
Hemoglobin: 10.5 g/dL — ABNORMAL LOW (ref 11.1–15.9)
MCH: 28.4 pg (ref 26.6–33.0)
MCHC: 32.7 g/dL (ref 31.5–35.7)
MCV: 87 fL (ref 79–97)
PLATELETS: 290 10*3/uL (ref 150–379)
RBC: 3.7 x10E6/uL — AB (ref 3.77–5.28)
RDW: 16.3 % — ABNORMAL HIGH (ref 12.3–15.4)
WBC: 10.9 10*3/uL — ABNORMAL HIGH (ref 3.4–10.8)

## 2016-04-18 LAB — URINALYSIS, ROUTINE W REFLEX MICROSCOPIC
Bilirubin, UA: NEGATIVE
GLUCOSE, UA: NEGATIVE
KETONES UA: NEGATIVE
Leukocytes, UA: NEGATIVE
Nitrite, UA: NEGATIVE
PROTEIN UA: NEGATIVE
RBC, UA: NEGATIVE
SPEC GRAV UA: 1.008 (ref 1.005–1.030)
UUROB: 0.2 mg/dL (ref 0.2–1.0)
pH, UA: 6.5 (ref 5.0–7.5)

## 2016-04-18 LAB — AFP, QUAD SCREEN
DIA MOM VALUE: 3.14
DIA VALUE (EIA): 556.44 pg/mL
DSR (By Age)    1 IN: 38
DSR (Second Trimester) 1 IN: 10
GESTATIONAL AGE AFP: 16.6 wk
MSAFP Mom: 1.32
MSAFP: 52.4 ng/mL
MSHCG MOM: 1.85
MSHCG: 64105 m[IU]/mL
Maternal Age At EDD: 43 YEARS
Osb Risk: 9058
Test Results:: POSITIVE — AB
UE3 MOM: 0.53
Weight: 148 [lb_av]
uE3 Value: 0.51 ng/mL

## 2016-04-18 LAB — PMP SCREEN PROFILE (10S), URINE
AMPHETAMINE SCRN UR: NEGATIVE ng/mL
BARBITURATE SCRN UR: NEGATIVE ng/mL
Benzodiazepine Screen, Urine: NEGATIVE ng/mL
CREATININE(CRT), U: 36.5 mg/dL (ref 20.0–300.0)
Cannabinoids Ur Ql Scn: POSITIVE ng/mL
Cocaine(Metab.)Screen, Urine: POSITIVE ng/mL
METHADONE SCREEN, URINE: NEGATIVE ng/mL
OPIATE SCRN UR: NEGATIVE ng/mL
Oxycodone+Oxymorphone Ur Ql Scn: NEGATIVE ng/mL
PCP SCRN UR: NEGATIVE ng/mL
PROPOXYPHENE SCREEN: NEGATIVE ng/mL
Ph of Urine: 6.2 (ref 4.5–8.9)

## 2016-04-18 LAB — ABO/RH: RH TYPE: POSITIVE

## 2016-04-18 LAB — VARICELLA ZOSTER ANTIBODY, IGG: VARICELLA: 3696 {index} (ref >165)

## 2016-04-18 LAB — HEPATITIS B SURFACE ANTIGEN: Hepatitis B Surface Ag: NEGATIVE

## 2016-04-18 LAB — ANTIBODY SCREEN: Antibody Screen: NEGATIVE

## 2016-04-18 LAB — RUBELLA SCREEN: Rubella Antibodies, IGG: 12.8 index (ref >0.99)

## 2016-04-18 LAB — HIV ANTIBODY (ROUTINE TESTING W REFLEX): HIV Screen 4th Generation wRfx: NONREACTIVE

## 2016-04-18 LAB — RPR: RPR Ser Ql: NONREACTIVE

## 2016-04-23 ENCOUNTER — Other Ambulatory Visit: Payer: Self-pay | Admitting: Women's Health

## 2016-04-23 ENCOUNTER — Telehealth: Payer: Self-pay | Admitting: Women's Health

## 2016-04-23 DIAGNOSIS — O0992 Supervision of high risk pregnancy, unspecified, second trimester: Secondary | ICD-10-CM

## 2016-04-23 DIAGNOSIS — O285 Abnormal chromosomal and genetic finding on antenatal screening of mother: Secondary | ICD-10-CM

## 2016-04-23 MED ORDER — FERROUS SULFATE 325 (65 FE) MG PO TABS: 325.0000 mg | ORAL_TABLET | Freq: Two times a day (BID) | ORAL | 3 refills | Status: DC

## 2016-04-23 NOTE — Telephone Encounter (Signed)
Pt returned call, notified her of mild anemia, rx fe bid, increase fe-rich foods, continue pnv. Also notified of 1:10 DSR on AFP, not diagnostic, offered cfDNA and genetic counseling, wants cfDNA but not genetic counseling at this time. Informaseq ordered, pt to come this week to have drawn.  Cheral MarkerKimberly R. Yazmina Pareja, CNM, WHNP-BC 04/23/2016 1:50 PM

## 2016-04-23 NOTE — Telephone Encounter (Signed)
LM for pt to return call, need to notify of anemia- rx'd fe/increase fe-rich foods, abnormal genetic screening w/ increased r/f Down Syndrome- offer cfDNA and referral to genetic counselor/MFM.  Cheral MarkerKimberly R. Afton Lavalle, CNM, [facility]NP-BC 04/23/2016 10:43 AM

## 2016-04-24 ENCOUNTER — Other Ambulatory Visit: Payer: Self-pay | Admitting: Women's Health

## 2016-04-24 ENCOUNTER — Telehealth: Payer: Self-pay | Admitting: *Deleted

## 2016-04-24 NOTE — Telephone Encounter (Signed)
Patient called [facility] Pt verbalized understanding. PRimary pharmacy changed.

## 2016-05-01 ENCOUNTER — Encounter: Payer: Self-pay | Admitting: Women's Health

## 2016-05-03 ENCOUNTER — Other Ambulatory Visit: Payer: Self-pay

## 2016-05-04 ENCOUNTER — Telehealth: Payer: Self-pay | Admitting: *Deleted

## 2016-05-04 LAB — INFORMASEQ(SM) WITH XY ANALYSIS
FETAL FRACTION (%): 22.1
GESTATIONAL AGE AT COLLECTION: 18.2 wk

## 2016-05-04 NOTE — Telephone Encounter (Signed)
Patient called and make aware of normal informaseq results. Also informed it was a boy. Pt was very ecstatic.

## 2016-05-07 ENCOUNTER — Encounter: Payer: Self-pay | Admitting: Obstetrics & Gynecology

## 2016-05-07 ENCOUNTER — Ambulatory Visit (INDEPENDENT_AMBULATORY_CARE_PROVIDER_SITE_OTHER): Payer: Medicaid Other

## 2016-05-07 ENCOUNTER — Ambulatory Visit (INDEPENDENT_AMBULATORY_CARE_PROVIDER_SITE_OTHER): Payer: Medicaid Other | Admitting: Obstetrics & Gynecology

## 2016-05-07 VITALS — BP 92/50 | HR 74 | Wt 153.0 lb

## 2016-05-07 DIAGNOSIS — Z3482 Encounter for supervision of other normal pregnancy, second trimester: Secondary | ICD-10-CM | POA: Diagnosis not present

## 2016-05-07 DIAGNOSIS — Z3A20 20 weeks gestation of pregnancy: Secondary | ICD-10-CM

## 2016-05-07 DIAGNOSIS — Z331 Pregnant state, incidental: Secondary | ICD-10-CM | POA: Diagnosis not present

## 2016-05-07 DIAGNOSIS — Z363 Encounter for antenatal screening for malformations: Secondary | ICD-10-CM | POA: Diagnosis not present

## 2016-05-07 DIAGNOSIS — Z1389 Encounter for screening for other disorder: Secondary | ICD-10-CM | POA: Diagnosis not present

## 2016-05-07 DIAGNOSIS — O0992 Supervision of high risk pregnancy, unspecified, second trimester: Secondary | ICD-10-CM

## 2016-05-07 LAB — POCT URINALYSIS DIPSTICK
GLUCOSE UA: NEGATIVE
KETONES UA: NEGATIVE
Leukocytes, UA: NEGATIVE
Nitrite, UA: NEGATIVE
Protein, UA: NEGATIVE

## 2016-05-07 NOTE — Progress Notes (Signed)
US 20+1 wks,cephalic,post pl gr 0,normal ov's bilat,cx 5.5 cm,svp of fluid 4.5 cm,fhr 148 bpm,efw 320 g,anatomy complete,no obvious abnormalities seen

## 2016-05-07 NOTE — Progress Notes (Signed)
[id number] 6757w1d Estimated Date of Delivery: 09/23/16  Blood pressure (!) 92/50, pulse 74, weight 153 lb (69.4 kg), last menstrual period 12/18/2015, unknown if currently breastfeeding.   BP weight and urine results all reviewed and noted.  Please refer to the obstetrical flow sheet for the fundal height and fetal heart rate documentation:  Patient reports good fetal movement, denies any bleeding and no rupture of membranes symptoms or regular contractions. Patient is without complaints. All questions were answered.  Orders Placed This Encounter  Procedures  . Urine culture  . POCT Urinalysis Dipstick    Plan:  Continued routine obstetrical care, sonogram is normal  Upper chest rash allergic vs pityriasis rosea  Return in about 4 weeks (around 06/04/2016) for LROB.

## 2016-05-09 LAB — URINE CULTURE

## 2016-05-15 ENCOUNTER — Telehealth: Payer: Self-pay | Admitting: Obstetrics & Gynecology

## 2016-05-15 NOTE — Telephone Encounter (Signed)
Patient called regarding urine culture results. Informed patient culture was normal. Pt verbalized understanding.

## 2016-06-04 ENCOUNTER — Encounter: Payer: Medicaid Other | Admitting: Women's Health

## 2016-06-14 ENCOUNTER — Encounter: Payer: Medicaid Other | Admitting: Women's Health

## 2016-06-18 ENCOUNTER — Encounter: Payer: Medicaid Other | Admitting: Obstetrics and Gynecology

## 2016-07-05 ENCOUNTER — Encounter: Payer: Medicaid Other | Admitting: Women's Health

## 2016-07-31 ENCOUNTER — Encounter: Payer: Medicaid Other | Admitting: Advanced Practice Midwife

## 2016-08-02 ENCOUNTER — Encounter: Payer: Self-pay | Admitting: Advanced Practice Midwife

## 2016-08-02 ENCOUNTER — Ambulatory Visit (INDEPENDENT_AMBULATORY_CARE_PROVIDER_SITE_OTHER): Payer: Medicaid Other | Admitting: Advanced Practice Midwife

## 2016-08-02 ENCOUNTER — Other Ambulatory Visit (HOSPITAL_COMMUNITY)
Admission: RE | Admit: 2016-08-02 | Discharge: 2016-08-02 | Disposition: A | Discharge status: Home / Self Care | Payer: Medicaid Other | Source: Ambulatory Visit | Attending: Advanced Practice Midwife | Admitting: Advanced Practice Midwife

## 2016-08-02 VITALS — BP 122/84 | HR 83 | Wt 166.0 lb

## 2016-08-02 DIAGNOSIS — Z124 Encounter for screening for malignant neoplasm of cervix: Secondary | ICD-10-CM | POA: Insufficient documentation

## 2016-08-02 DIAGNOSIS — Z3483 Encounter for supervision of other normal pregnancy, third trimester: Secondary | ICD-10-CM | POA: Diagnosis not present

## 2016-08-02 DIAGNOSIS — O09522 Supervision of elderly multigravida, second trimester: Secondary | ICD-10-CM | POA: Diagnosis not present

## 2016-08-02 DIAGNOSIS — Z331 Pregnant state, incidental: Secondary | ICD-10-CM

## 2016-08-02 DIAGNOSIS — Z1389 Encounter for screening for other disorder: Secondary | ICD-10-CM

## 2016-08-02 NOTE — Progress Notes (Signed)
error 

## 2016-08-02 NOTE — Progress Notes (Signed)
[id number] 6981w4d Estimated Date of Delivery: 09/23/16  Blood pressure 122/84, pulse 83, weight 166 lb (75.3 kg), last menstrual period 12/18/2015, unknown if currently breastfeeding.    No PNC since March.  (no good reason except  She's been busy and hans't been able to catch a ride).  Says can't void today. Can't stay for gtt.  Refuses 2 hour, but will do 1 hour tomorrow afternoon.  Pap done today.   BP weight and urine results all reviewed and noted.  Please refer to the obstetrical flow sheet for the fundal height and fetal heart rate documentation:  Patient reports good fetal movement, denies any bleeding and no rupture of membranes symptoms or regular contractions. Patient is without complaints. All questions were answered.  Orders Placed This Encounter  Procedures  . US OB Follow Up  . Biophysical profile    Plan:  Continued routine obstetrical care,   Return for tomorrow for 1 hour gtt only; 2 weeks for LROB. And growth us

## 2016-08-03 ENCOUNTER — Other Ambulatory Visit: Payer: Medicaid Other

## 2016-08-06 ENCOUNTER — Inpatient Hospital Stay (HOSPITAL_COMMUNITY)
Admission: AD | Admit: 2016-08-06 | Discharge: 2016-08-09 | DRG: 765 | Disposition: A | Discharge status: Home / Self Care | Payer: Medicaid Other | Source: Ambulatory Visit | Attending: Obstetrics and Gynecology | Admitting: Obstetrics and Gynecology

## 2016-08-06 ENCOUNTER — Inpatient Hospital Stay (HOSPITAL_COMMUNITY): Payer: Medicaid Other

## 2016-08-06 ENCOUNTER — Encounter (HOSPITAL_COMMUNITY): Payer: Self-pay | Admitting: *Deleted

## 2016-08-06 ENCOUNTER — Encounter (HOSPITAL_COMMUNITY): Payer: Self-pay

## 2016-08-06 DIAGNOSIS — O42919 Preterm premature rupture of membranes, unspecified as to length of time between rupture and onset of labor, unspecified trimester: Secondary | ICD-10-CM | POA: Diagnosis present

## 2016-08-06 DIAGNOSIS — O34211 Maternal care for low transverse scar from previous cesarean delivery: Secondary | ICD-10-CM | POA: Diagnosis present

## 2016-08-06 DIAGNOSIS — F17210 Nicotine dependence, cigarettes, uncomplicated: Secondary | ICD-10-CM | POA: Diagnosis present

## 2016-08-06 DIAGNOSIS — O285 Abnormal chromosomal and genetic finding on antenatal screening of mother: Secondary | ICD-10-CM | POA: Diagnosis present

## 2016-08-06 DIAGNOSIS — O134 Gestational [pregnancy-induced] hypertension without significant proteinuria, complicating childbirth: Secondary | ICD-10-CM | POA: Diagnosis present

## 2016-08-06 DIAGNOSIS — O99324 Drug use complicating childbirth: Secondary | ICD-10-CM | POA: Diagnosis present

## 2016-08-06 DIAGNOSIS — O26893 Other specified pregnancy related conditions, third trimester: Secondary | ICD-10-CM | POA: Diagnosis present

## 2016-08-06 DIAGNOSIS — D6862 Lupus anticoagulant syndrome: Secondary | ICD-10-CM | POA: Diagnosis present

## 2016-08-06 DIAGNOSIS — Z302 Encounter for sterilization: Secondary | ICD-10-CM | POA: Diagnosis not present

## 2016-08-06 DIAGNOSIS — O4290 Premature rupture of membranes, unspecified as to length of time between rupture and onset of labor, unspecified weeks of gestation: Secondary | ICD-10-CM

## 2016-08-06 DIAGNOSIS — O9912 Other diseases of the blood and blood-forming organs and certain disorders involving the immune mechanism complicating childbirth: Secondary | ICD-10-CM | POA: Diagnosis present

## 2016-08-06 DIAGNOSIS — Z8751 Personal history of pre-term labor: Secondary | ICD-10-CM

## 2016-08-06 DIAGNOSIS — F17200 Nicotine dependence, unspecified, uncomplicated: Secondary | ICD-10-CM | POA: Diagnosis present

## 2016-08-06 DIAGNOSIS — O99334 Smoking (tobacco) complicating childbirth: Secondary | ICD-10-CM | POA: Diagnosis present

## 2016-08-06 DIAGNOSIS — O98513 Other viral diseases complicating pregnancy, third trimester: Secondary | ICD-10-CM

## 2016-08-06 DIAGNOSIS — O321XX0 Maternal care for breech presentation, not applicable or unspecified: Secondary | ICD-10-CM | POA: Diagnosis present

## 2016-08-06 DIAGNOSIS — D649 Anemia, unspecified: Secondary | ICD-10-CM | POA: Diagnosis present

## 2016-08-06 DIAGNOSIS — M329 Systemic lupus erythematosus, unspecified: Secondary | ICD-10-CM

## 2016-08-06 DIAGNOSIS — O9902 Anemia complicating childbirth: Secondary | ICD-10-CM | POA: Diagnosis present

## 2016-08-06 DIAGNOSIS — O42913 Preterm premature rupture of membranes, unspecified as to length of time between rupture and onset of labor, third trimester: Secondary | ICD-10-CM

## 2016-08-06 DIAGNOSIS — R739 Hyperglycemia, unspecified: Secondary | ICD-10-CM | POA: Diagnosis present

## 2016-08-06 DIAGNOSIS — O99320 Drug use complicating pregnancy, unspecified trimester: Secondary | ICD-10-CM | POA: Diagnosis present

## 2016-08-06 DIAGNOSIS — O9989 Other specified diseases and conditions complicating pregnancy, childbirth and the puerperium: Secondary | ICD-10-CM

## 2016-08-06 DIAGNOSIS — F1910 Other psychoactive substance abuse, uncomplicated: Secondary | ICD-10-CM | POA: Diagnosis present

## 2016-08-06 DIAGNOSIS — R760 Raised antibody titer: Secondary | ICD-10-CM | POA: Diagnosis present

## 2016-08-06 DIAGNOSIS — O368390 Maternal care for abnormalities of the fetal heart rate or rhythm, unspecified trimester, not applicable or unspecified: Secondary | ICD-10-CM

## 2016-08-06 DIAGNOSIS — B009 Herpesviral infection, unspecified: Secondary | ICD-10-CM | POA: Diagnosis present

## 2016-08-06 DIAGNOSIS — O368130 Decreased fetal movements, third trimester, not applicable or unspecified: Secondary | ICD-10-CM | POA: Diagnosis present

## 2016-08-06 DIAGNOSIS — Z3689 Encounter for other specified antenatal screening: Secondary | ICD-10-CM

## 2016-08-06 DIAGNOSIS — O09529 Supervision of elderly multigravida, unspecified trimester: Secondary | ICD-10-CM

## 2016-08-06 DIAGNOSIS — Z3A33 33 weeks gestation of pregnancy: Secondary | ICD-10-CM

## 2016-08-06 DIAGNOSIS — O09523 Supervision of elderly multigravida, third trimester: Secondary | ICD-10-CM

## 2016-08-06 DIAGNOSIS — O34219 Maternal care for unspecified type scar from previous cesarean delivery: Secondary | ICD-10-CM

## 2016-08-06 DIAGNOSIS — O99891 Other specified diseases and conditions complicating pregnancy: Secondary | ICD-10-CM

## 2016-08-06 DIAGNOSIS — O99333 Smoking (tobacco) complicating pregnancy, third trimester: Secondary | ICD-10-CM

## 2016-08-06 DIAGNOSIS — O99323 Drug use complicating pregnancy, third trimester: Secondary | ICD-10-CM

## 2016-08-06 DIAGNOSIS — O288 Other abnormal findings on antenatal screening of mother: Secondary | ICD-10-CM

## 2016-08-06 DIAGNOSIS — O0990 Supervision of high risk pregnancy, unspecified, unspecified trimester: Secondary | ICD-10-CM

## 2016-08-06 DIAGNOSIS — Z98891 History of uterine scar from previous surgery: Secondary | ICD-10-CM

## 2016-08-06 DIAGNOSIS — Z363 Encounter for antenatal screening for malformations: Secondary | ICD-10-CM

## 2016-08-06 LAB — GLUCOSE, CAPILLARY
GLUCOSE-CAPILLARY: 111 mg/dL — AB (ref 65–99)
Glucose-Capillary: 183 mg/dL — ABNORMAL HIGH (ref 65–99)
Glucose-Capillary: 183 mg/dL — ABNORMAL HIGH (ref 65–99)
Glucose-Capillary: 184 mg/dL — ABNORMAL HIGH (ref 65–99)

## 2016-08-06 LAB — CBC
HCT: 36.1 % (ref 36.0–46.0)
Hemoglobin: 12.4 g/dL (ref 12.0–15.0)
MCH: 31.2 pg (ref 26.0–34.0)
MCHC: 34.3 g/dL (ref 30.0–36.0)
MCV: 90.9 fL (ref 78.0–100.0)
PLATELETS: 201 10*3/uL (ref 150–400)
RBC: 3.97 MIL/uL (ref 3.87–5.11)
RDW: 14.4 % (ref 11.5–15.5)
WBC: 27.2 10*3/uL — AB (ref 4.0–10.5)

## 2016-08-06 LAB — HIV ANTIBODY (ROUTINE TESTING W REFLEX): HIV Screen 4th Generation wRfx: NONREACTIVE

## 2016-08-06 LAB — RAPID URINE DRUG SCREEN, HOSP PERFORMED
Amphetamines: NOT DETECTED
BARBITURATES: NOT DETECTED
BENZODIAZEPINES: NOT DETECTED
Cocaine: NOT DETECTED
Opiates: NOT DETECTED
Tetrahydrocannabinol: NOT DETECTED

## 2016-08-06 LAB — TYPE AND SCREEN
ABO/RH(D): O POS
Antibody Screen: NEGATIVE

## 2016-08-06 LAB — POCT FERN TEST: POCT FERN TEST: POSITIVE

## 2016-08-06 LAB — GROUP B STREP BY PCR: GROUP B STREP BY PCR: NEGATIVE

## 2016-08-06 MED ORDER — AZITHROMYCIN 250 MG PO TABS
1000.0000 mg | ORAL_TABLET | Freq: Once | ORAL | Status: DC
  Filled 2016-08-06: qty 4

## 2016-08-06 MED ORDER — BETAMETHASONE SOD PHOS & ACET 6 (3-3) MG/ML IJ SUSP: 12.0000 mg | INTRAMUSCULAR | Status: DC

## 2016-08-06 MED ORDER — VALACYCLOVIR HCL 500 MG PO TABS
1000.0000 mg | ORAL_TABLET | Freq: Every day | ORAL | Status: DC
  Administered 2016-08-06 – 2016-08-07 (×2): 1000 mg via ORAL
  Filled 2016-08-06 (×3): qty 2

## 2016-08-06 MED ORDER — TERBUTALINE SULFATE 1 MG/ML IJ SOLN
INTRAMUSCULAR | Status: AC
  Administered 2016-08-06: 0.25 mg via SUBCUTANEOUS
  Filled 2016-08-06: qty 1

## 2016-08-06 MED ORDER — CALCIUM CARBONATE ANTACID 500 MG PO CHEW: 2.0000 | CHEWABLE_TABLET | ORAL | Status: DC | PRN

## 2016-08-06 MED ORDER — HYDRALAZINE HCL 20 MG/ML IJ SOLN: 10.0000 mg | Freq: Once | INTRAMUSCULAR | Status: DC | PRN

## 2016-08-06 MED ORDER — MAGNESIUM SULFATE 40 G IN LACTATED RINGERS - SIMPLE
2.0000 g/h | INTRAVENOUS | Status: DC
  Administered 2016-08-07: 2 g/h via INTRAVENOUS
  Filled 2016-08-06: qty 40
  Filled 2016-08-06: qty 500

## 2016-08-06 MED ORDER — PRENATAL MULTIVITAMIN CH
1.0000 | ORAL_TABLET | Freq: Every day | ORAL | Status: DC
  Filled 2016-08-06 (×2): qty 1

## 2016-08-06 MED ORDER — MAGNESIUM SULFATE BOLUS VIA INFUSION
4.0000 g | Freq: Once | INTRAVENOUS | Status: AC
  Administered 2016-08-06: 4 g via INTRAVENOUS
  Filled 2016-08-06: qty 500

## 2016-08-06 MED ORDER — MAGNESIUM SULFATE 40 G IN LACTATED RINGERS - SIMPLE
2.0000 g/h | INTRAVENOUS | Status: DC
  Filled 2016-08-06: qty 500

## 2016-08-06 MED ORDER — SODIUM CHLORIDE 0.9 % IV SOLN
2.0000 g | Freq: Four times a day (QID) | INTRAVENOUS | Status: DC
  Administered 2016-08-06 – 2016-08-07 (×5): 2 g via INTRAVENOUS
  Filled 2016-08-06 (×8): qty 2000

## 2016-08-06 MED ORDER — ACETAMINOPHEN 325 MG PO TABS
650.0000 mg | ORAL_TABLET | ORAL | Status: DC | PRN
  Administered 2016-08-06: 650 mg via ORAL
  Filled 2016-08-06: qty 2

## 2016-08-06 MED ORDER — DOCUSATE SODIUM 100 MG PO CAPS
100.0000 mg | ORAL_CAPSULE | Freq: Every day | ORAL | Status: DC
  Filled 2016-08-06: qty 1

## 2016-08-06 MED ORDER — PREDNISONE 5 MG PO TABS
5.0000 mg | ORAL_TABLET | Freq: Two times a day (BID) | ORAL | Status: DC
  Administered 2016-08-06: 5 mg via ORAL
  Administered 2016-08-06: 10 mg via ORAL
  Administered 2016-08-06 – 2016-08-07 (×2): 5 mg via ORAL
  Filled 2016-08-06 (×6): qty 1

## 2016-08-06 MED ORDER — LACTATED RINGERS IV SOLN
INTRAVENOUS | Status: DC
  Administered 2016-08-06 – 2016-08-07 (×5): via INTRAVENOUS

## 2016-08-06 MED ORDER — INSULIN ASPART 100 UNIT/ML ~~LOC~~ SOLN
4.0000 [IU] | Freq: Once | SUBCUTANEOUS | Status: AC
  Administered 2016-08-06: 4 [IU] via SUBCUTANEOUS

## 2016-08-06 MED ORDER — BUTORPHANOL TARTRATE 1 MG/ML IJ SOLN
1.0000 mg | INTRAMUSCULAR | Status: DC | PRN
  Administered 2016-08-06: 1 mg via INTRAVENOUS
  Filled 2016-08-06: qty 1

## 2016-08-06 MED ORDER — LABETALOL HCL 5 MG/ML IV SOLN: 20.0000 mg | INTRAVENOUS | Status: DC | PRN

## 2016-08-06 MED ORDER — AMOXICILLIN 500 MG PO CAPS: 500.0000 mg | ORAL_CAPSULE | Freq: Three times a day (TID) | ORAL | Status: DC

## 2016-08-06 MED ORDER — TERBUTALINE SULFATE 1 MG/ML IJ SOLN
0.2500 mg | Freq: Once | INTRAMUSCULAR | Status: AC
  Administered 2016-08-06: 0.25 mg via SUBCUTANEOUS

## 2016-08-06 MED ORDER — VALACYCLOVIR HCL 500 MG PO TABS
500.0000 mg | ORAL_TABLET | Freq: Two times a day (BID) | ORAL | Status: DC
  Filled 2016-08-06: qty 1

## 2016-08-06 MED ORDER — LACTATED RINGERS IV BOLUS (SEPSIS)
1000.0000 mL | Freq: Once | INTRAVENOUS | Status: AC
  Administered 2016-08-06: 1000 mL via INTRAVENOUS

## 2016-08-06 MED ORDER — BETAMETHASONE SOD PHOS & ACET 6 (3-3) MG/ML IJ SUSP
12.0000 mg | INTRAMUSCULAR | Status: AC
  Administered 2016-08-06 – 2016-08-07 (×2): 12 mg via INTRAMUSCULAR
  Filled 2016-08-06 (×2): qty 2

## 2016-08-06 MED ORDER — PREDNISONE 5 MG PO TABS: 5.0000 mg | ORAL_TABLET | Freq: Two times a day (BID) | ORAL | Status: DC

## 2016-08-06 NOTE — Progress Notes (Signed)
OB Note  S: feels UCs better spaced out and hard to say when she feels them AF VS normal and stable Category I with accels +irritability, occasional UCs NAD 2/25/high (no change from prior SVE with different provider)  A/p: pt stable Will start mg until bmz#2 at 0700 on 6/5 Consult NICU Growth u/s for tomorrow ordered D/w pt need for c-section if still breech and labors Pt would like BTL; r/b/a d/w including permnancy; will have her sign papers UDS ordered Pt on valtrex. No s/s of outbreak Pt on pred[place] [place]. Will continue this and do q6h BS checks.  Limited PNC: no glucola testing. Will hold off since bmz recently and BS checks a6h  Caroline Bishop, Jr MD Attending [facility] 08/06/2016 Time: 0900

## 2016-08-06 NOTE — Progress Notes (Signed)
Patient ID: Caroline Bishop, female   DOB: [birth year], 41 y.o.   MRN: [id number] Caroline Bishop is a 41 y.o. [id number] at [redacted]w[redacted]d who presents for LOF & contractions. Reports SROM 40 minutes prior to arrival with large gush of clear fluid. Leaking has continued. Reports contractions every 5 minutes since Saturday. Rates pain  7/10. Has taken ibuprofen without relief. Go[facility] to [facility]; has missed multiple appointments. Hx of preterm delivery via c/section with subsequent term VBAC. Decreased fetal movement this morning.           OB History    Gravida Para Term Preterm AB Living   6 3 2 1 2 3    SAB TAB Ectopic Multiple Live Births   1 1     3           Past Medical History:  Diagnosis Date  . Lupus anticoagulant complicating pregnancy in first trimester, antepartum [facility])          Past Surgical History:  Procedure Laterality Date  . CESAREAN SECTION    . CHOLECYSTECTOMY           Family History  Problem Relation Age of Onset  . Lung cancer Mother   . Aneurysm Father        Brain  . Asthma Brother   . Diabetes Maternal Grandmother   . Hypertension Maternal Grandmother           Social History  Substance Use Topics  . Smoking status: Current Every Day Smoker    Packs/day: 0.00    Years: 20.00    Types: Cigarettes  . Smokeless tobacco: Never Used     Comment: smokes a few a day  . Alcohol use No     Comment: stopped with pregnancy    Allergies: No Known Allergies         Prescriptions Prior to Admission  Medication Sig Dispense Refill Last Dose  . ferrous sulfate 325 (65 FE) MG tablet Take 1 tablet (325 mg total) by mouth 2 (two) times daily with a meal. (Patient not taking: Reported on 08/02/2016) 60 tablet 3 Not Taking  . predniSONE (DELTASONE) 5 MG tablet Take 5 mg by mouth 2 (two) times daily with a meal.   Taking  . Prenat w/o A-FeCbGl-DSS-FA-DHA (CITRANATAL ASSURE) 35-1 & 300 MG tablet One tablet  and one capsule daily 60 tablet 11 Taking    Review of Systems  Constitutional: Negative.   Gastrointestinal: Positive for abdominal pain.  Genitourinary: Positive for vaginal discharge. Negative for vaginal bleeding.   Physical Exam   Blood pressure 123/83, pulse (!) 108, temperature 99.2 F (37.3 C), temperature source Oral, resp. rate 18, last menstrual period 12/18/2015, unknown if currently breastfeeding.  Physical Exam  Nursing note and vitals reviewed. Constitutional: She is oriented to person, place, and time. She appears well-developed and well-nourished. No distress.  HENT:  Head: Normocephalic and atraumatic.  Eyes: Conjunctivae are normal. Right eye exhibits no discharge. Left eye exhibits no discharge. No scleral icterus.  Neck: Normal range of motion.  Respiratory: Effort normal. No respiratory distress.  GI: Soft.  Ctx palpate moderate  Genitourinary:  Genitourinary Comments: Clear fluid actively leaking from vagina. SSE deferred d/t fetal tracing.   Neurological: She is alert and oriented to person, place, and time.  Skin: Skin is warm and dry. She is not diaphoretic.  Psychiatric: She has a normal mood and affect. Her behavior is normal. Judgment and thought content normal.  Dilation: 2.5 Effacement (%): 70 Cervical Position: Anterior Station: -3 Exam by:: Judeth HornErin Lawrence NP  Fetal Tracing:  Baseline: 155 Variability: moderate Accelerations: none Decelerations: recurrent variables  Toco: Q3-5 mins MAU Course  Procedures      Results for orders placed or performed during the hospital encounter of 08/06/16 (from the past 24 hour(s))  POCT fern test     Status: Abnormal   Collection Time: 08/06/16  6:20 AM  Result Value Ref Range   POCT Fern Test Positive = ruptured amniotic membanes     MDM Patient is grossly rupture, clear fluid, positive fern slide.  SVE 2.5/70%, unable to determine fetal presentation IV fluid bolus started d/t  recurrent variable decelerations Bedside ultrasound to verify presentation- frank breech  Assessment and Plan  A: 1. Amniotic fluid leaking   2. Determine fetal presentation using ultrasound   3. [redacted] weeks gestation of pregnancy   4. Breech presentation, single or unspecified fetus   5. Variable fetal heart rate decelerations, antepartum    P: Antenatal admission orders -- pt to go to birthing suites until labor has been ruled out BMZ IV fluids Terbutaline x 1 given in MAU Pain management prn Discussed possibility of delivery via c-section if labor and persistent breech Discussed plan for delivery at 34 weeks if no si/sx of chorio prior to 34 weeks Will start latency antibiotics All questions were answered  Judeth Hornrin Lawrence 08/06/2016, 6:23 AM

## 2016-08-06 NOTE — MAU Note (Signed)
Patient to MAU with c/o rupture of membranes at 0520. States it was a big gush that soaked through clothes, clear fluid. Has not felt baby move as much today. Been contracting since yesterday, did not call dr. Rochele Pagesook ibuprofen with no relief. Hx of preterm delivery. Denies VB.

## 2016-08-06 NOTE — Progress Notes (Signed)
OB Note  S: UCs feels less intense than before. Feels them ?q4624m O: AF VS normal and stable Category I  Irregular UCs Upset and somewhat emotional; pt is talking on the phone with SW SVE deferred  BS: 180s  A/P: pt stable Continue with current plan of care Will give one time aspart dose for elevated PP BS values in the 180s and continue to follow Pt states she's upset due to SW issues. UDS neg. SVE deferred  Caroline Bishop, Jr MD Attending Center for [facility] (Faculty Practice) 08/06/2016 Time: [id number]

## 2016-08-06 NOTE — MAU Provider Note (Signed)
History     CSN: [id number]  Arrival date and time: 08/06/16 [id number]   First Provider Initiated Contact with Patient 08/06/16 [phone]      Chief Complaint  Patient presents with  . Rupture of Membranes   HPI  Caroline Bishop is a 41 y.o. [id number] at 7166w1d who presents for LOF & contractions. Reports SROM 40 minutes prior to arrival with large gush of clear fluid. Leaking has continued. Reports contractions every 5 minutes since Saturday. Rates pain  7/10. Has taken ibuprofen without relief. [facility]; has missed multiple appointments. Hx of preterm delivery via c/section with subsequent term VBAC. Decreased fetal movement this morning.   OB History    Gravida Para Term Preterm AB Living   6 3 2 1 2 3    SAB TAB Ectopic Multiple Live Births   1 1     3       Past Medical History:  Diagnosis Date  . 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 CESAREAN SECTION    . CHOLECYSTECTOMY      Family History  Problem Relation Age of Onset  . Lung cancer Mother   . Aneurysm Father        Brain  . Asthma Brother   . Diabetes Maternal Grandmother   . Hypertension Maternal Grandmother     Social History  Substance Use Topics  . Smoking status: Current Every Day Smoker    Packs/day: 0.00    Years: 20.00    Types: Cigarettes  . Smokeless tobacco: Never Used     Comment: smokes a few a day  . Alcohol use No     Comment: stopped with pregnancy    Allergies: No Known Allergies  Prescriptions Prior to Admission  Medication Sig Dispense Refill Last Dose  . ferrous sulfate 325 (65 FE) MG tablet Take 1 tablet (325 mg total) by mouth 2 (two) times daily with a meal. (Patient not taking: Reported on 08/02/2016) 60 tablet 3 Not Taking  . predniSONE (DELTASONE) 5 MG tablet Take 5 mg by mouth 2 (two) times daily with a meal.   Taking  . Prenat w/o A-FeCbGl-DSS-FA-DHA  (CITRANATAL ASSURE) 35-1 & 300 MG tablet One tablet and one capsule daily 60 tablet 11 Taking    Review of Systems  Constitutional: Negative.   Gastrointestinal: Positive for abdominal pain.  Genitourinary: Positive for vaginal discharge. Negative for vaginal bleeding.   Physical Exam   Blood pressure 123/83, pulse (!) 108, temperature 99.2 F (37.3 C), temperature source Oral, resp. rate 18, last menstrual period 12/18/2015, unknown if currently breastfeeding.  Physical Exam  Nursing note and vitals reviewed. Constitutional: She is oriented to person, place, and time. She appears well-developed and well-nourished. No distress.  HENT:  Head: Normocephalic and atraumatic.  Eyes: Conjunctivae are normal. Right eye exhibits no discharge. Left eye exhibits no discharge. No scleral icterus.  Neck: Normal range of motion.  Respiratory: Effort normal. No respiratory distress.  GI: Soft.  Ctx palpate moderate  Genitourinary:  Genitourinary Comments: Clear fluid actively leaking from vagina. SSE deferred d/t fetal tracing.   Neurological: She is alert and oriented to person, place, and time.  Skin: Skin is warm and dry. She is not diaphoretic.  Psychiatric: She has a normal mood and affect. Her behavior is normal. Judgment and thought content normal.   Dilation: 2.5 Effacement (%): 70 Cervical Position:  Anterior Station: -3 Exam by:: Judeth Horn NP  Fetal Tracing:  Baseline: 155 Variability: moderate Accelerations: none Decelerations: recurrent variables  Toco: Q3-5 mins MAU Course  Procedures Results for orders placed or performed during the hospital encounter of 08/06/16 (from the past 24 hour(s))  POCT fern test     Status: Abnormal   Collection Time: 08/06/16  6:20 AM  Result Value Ref Range   POCT Fern Test Positive = ruptured amniotic membanes     MDM Patient is grossly rupture, clear fluid, positive fern slide.  SVE 2.5/70%, unable to determine fetal  presentation IV fluid bolus started d/t recurrent variable decelerations Bedside ultrasound to verify presentation BMZ ordered S/w Dr. Jolayne Panther regarding assessment & fetal tracing. Dr. Jolayne Panther en route to MAU to evaluate patient.  Assessment and Plan  A: 1. Amniotic fluid leaking   2. Determine fetal presentation using ultrasound   3. [redacted] weeks gestation of pregnancy   4. Breech presentation, single or unspecified fetus   5. Variable fetal heart rate decelerations, antepartum    P: Antenatal admission orders -- pt to go to birthing suites per Dr. Jolayne Panther BMZ IV fluids CEFM  Judeth Horn 08/06/2016, 6:23 AM

## 2016-08-07 ENCOUNTER — Encounter (HOSPITAL_COMMUNITY)
Admission: AD | Disposition: A | Discharge status: Home / Self Care | Payer: Self-pay | Source: Ambulatory Visit | Attending: Obstetrics and Gynecology

## 2016-08-07 ENCOUNTER — Inpatient Hospital Stay (HOSPITAL_COMMUNITY): Payer: Medicaid Other | Admitting: Anesthesiology

## 2016-08-07 ENCOUNTER — Encounter (HOSPITAL_COMMUNITY): Payer: Self-pay | Admitting: *Deleted

## 2016-08-07 ENCOUNTER — Inpatient Hospital Stay (HOSPITAL_COMMUNITY): Payer: Medicaid Other

## 2016-08-07 DIAGNOSIS — O42919 Preterm premature rupture of membranes, unspecified as to length of time between rupture and onset of labor, unspecified trimester: Secondary | ICD-10-CM

## 2016-08-07 DIAGNOSIS — R739 Hyperglycemia, unspecified: Secondary | ICD-10-CM | POA: Diagnosis present

## 2016-08-07 DIAGNOSIS — O321XX0 Maternal care for breech presentation, not applicable or unspecified: Secondary | ICD-10-CM

## 2016-08-07 DIAGNOSIS — O34211 Maternal care for low transverse scar from previous cesarean delivery: Secondary | ICD-10-CM

## 2016-08-07 DIAGNOSIS — Z3A33 33 weeks gestation of pregnancy: Secondary | ICD-10-CM

## 2016-08-07 DIAGNOSIS — Z302 Encounter for sterilization: Secondary | ICD-10-CM

## 2016-08-07 LAB — CULTURE, OB URINE
Culture: NO GROWTH
Special Requests: NORMAL

## 2016-08-07 LAB — CYTOLOGY - PAP
Chlamydia: NEGATIVE
DIAGNOSIS: NEGATIVE
HPV (WINDOPATH): NOT DETECTED
NEISSERIA GONORRHEA: NEGATIVE

## 2016-08-07 LAB — GLUCOSE, CAPILLARY: GLUCOSE-CAPILLARY: 131 mg/dL — AB (ref 65–99)

## 2016-08-07 LAB — GC/CHLAMYDIA PROBE AMP (~~LOC~~) NOT AT ARMC
CHLAMYDIA, DNA PROBE: NEGATIVE
Neisseria Gonorrhea: NEGATIVE

## 2016-08-07 LAB — ABO/RH: ABO/RH(D): O POS

## 2016-08-07 SURGERY — Surgical Case
Anesthesia: Spinal | Site: Abdomen | Wound class: Clean

## 2016-08-07 MED ORDER — OXYTOCIN 40 UNITS IN LACTATED RINGERS INFUSION - SIMPLE MED: 2.5000 [IU]/h | INTRAVENOUS | Status: AC

## 2016-08-07 MED ORDER — NALBUPHINE HCL 10 MG/ML IJ SOLN: 5.0000 mg | INTRAMUSCULAR | Status: DC | PRN

## 2016-08-07 MED ORDER — ONDANSETRON HCL 4 MG/2ML IJ SOLN: 4.0000 mg | Freq: Three times a day (TID) | INTRAMUSCULAR | Status: DC | PRN

## 2016-08-07 MED ORDER — KETOROLAC TROMETHAMINE 30 MG/ML IJ SOLN
30.0000 mg | Freq: Four times a day (QID) | INTRAMUSCULAR | Status: AC | PRN
  Administered 2016-08-07: 30 mg via INTRAMUSCULAR

## 2016-08-07 MED ORDER — SODIUM CHLORIDE 0.9% FLUSH: 3.0000 mL | INTRAVENOUS | Status: DC | PRN

## 2016-08-07 MED ORDER — DIPHENHYDRAMINE HCL 25 MG PO CAPS
25.0000 mg | ORAL_CAPSULE | ORAL | Status: DC | PRN
Start: 2016-08-07 — End: 2016-08-09
  Filled 2016-08-07: qty 1

## 2016-08-07 MED ORDER — OXYCODONE HCL 5 MG PO TABS
5.0000 mg | ORAL_TABLET | ORAL | Status: DC | PRN
  Filled 2016-08-07 (×2): qty 1

## 2016-08-07 MED ORDER — TETANUS-DIPHTH-ACELL PERTUSSIS 5-2.5-18.5 LF-MCG/0.5 IM SUSP: 0.5000 mL | Freq: Once | INTRAMUSCULAR | Status: DC

## 2016-08-07 MED ORDER — BUPIVACAINE IN DEXTROSE 0.75-8.25 % IT SOLN
INTRATHECAL | Status: DC | PRN
  Administered 2016-08-07: 12 mg via INTRATHECAL

## 2016-08-07 MED ORDER — FENTANYL CITRATE (PF) 100 MCG/2ML IJ SOLN
INTRAMUSCULAR | Status: DC | PRN
  Administered 2016-08-07: 10 ug via INTRATHECAL

## 2016-08-07 MED ORDER — ZOLPIDEM TARTRATE 5 MG PO TABS
5.0000 mg | ORAL_TABLET | Freq: Every evening | ORAL | Status: DC | PRN
  Administered 2016-08-08: 5 mg via ORAL
  Filled 2016-08-07 (×2): qty 1

## 2016-08-07 MED ORDER — PREDNISONE 10 MG PO TABS
10.0000 mg | ORAL_TABLET | Freq: Two times a day (BID) | ORAL | Status: DC
  Administered 2016-08-08 – 2016-08-09 (×3): 10 mg via ORAL
  Filled 2016-08-07 (×6): qty 1

## 2016-08-07 MED ORDER — SOD CITRATE-CITRIC ACID 500-334 MG/5ML PO SOLN
30.0000 mL | Freq: Once | ORAL | Status: AC
  Administered 2016-08-07: 30 mL via ORAL
  Filled 2016-08-07: qty 15

## 2016-08-07 MED ORDER — LACTATED RINGERS IV BOLUS (SEPSIS)
1000.0000 mL | Freq: Once | INTRAVENOUS | Status: AC
  Administered 2016-08-07: 1000 mL via INTRAVENOUS

## 2016-08-07 MED ORDER — SODIUM CHLORIDE 0.9 % IR SOLN
Status: DC | PRN
  Administered 2016-08-07: 1

## 2016-08-07 MED ORDER — MENTHOL 3 MG MT LOZG: 1.0000 | LOZENGE | OROMUCOSAL | Status: DC | PRN

## 2016-08-07 MED ORDER — SIMETHICONE 80 MG PO CHEW: 80.0000 mg | CHEWABLE_TABLET | ORAL | Status: DC | PRN

## 2016-08-07 MED ORDER — LACTATED RINGERS IV SOLN: INTRAVENOUS | Status: DC

## 2016-08-07 MED ORDER — ACETAMINOPHEN 325 MG PO TABS
650.0000 mg | ORAL_TABLET | ORAL | Status: DC | PRN
  Administered 2016-08-08 – 2016-08-09 (×3): 650 mg via ORAL
  Filled 2016-08-07 (×3): qty 2

## 2016-08-07 MED ORDER — DIPHENHYDRAMINE HCL 25 MG PO CAPS
25.0000 mg | ORAL_CAPSULE | Freq: Four times a day (QID) | ORAL | Status: DC | PRN
  Administered 2016-08-08: 25 mg via ORAL

## 2016-08-07 MED ORDER — KETOROLAC TROMETHAMINE 30 MG/ML IJ SOLN
30.0000 mg | Freq: Four times a day (QID) | INTRAMUSCULAR | Status: AC | PRN
Start: 2016-08-07 — End: 2016-08-08

## 2016-08-07 MED ORDER — NALBUPHINE HCL 10 MG/ML IJ SOLN: 5.0000 mg | Freq: Once | INTRAMUSCULAR | Status: DC | PRN

## 2016-08-07 MED ORDER — PROMETHAZINE HCL 25 MG/ML IJ SOLN: 6.2500 mg | INTRAMUSCULAR | Status: DC | PRN

## 2016-08-07 MED ORDER — HYDROMORPHONE HCL 1 MG/ML IJ SOLN
INTRAMUSCULAR | Status: AC
  Filled 2016-08-07: qty 1

## 2016-08-07 MED ORDER — ONDANSETRON HCL 4 MG/2ML IJ SOLN
INTRAMUSCULAR | Status: DC | PRN
  Administered 2016-08-07: 4 mg via INTRAVENOUS

## 2016-08-07 MED ORDER — SENNOSIDES-DOCUSATE SODIUM 8.6-50 MG PO TABS
2.0000 | ORAL_TABLET | ORAL | Status: DC
  Administered 2016-08-09: 2 via ORAL
  Filled 2016-08-07 (×2): qty 2

## 2016-08-07 MED ORDER — HYDROMORPHONE HCL 1 MG/ML IJ SOLN
0.2500 mg | INTRAMUSCULAR | Status: DC | PRN
  Administered 2016-08-07: 0.25 mg via INTRAVENOUS
  Administered 2016-08-07: 0.5 mg via INTRAVENOUS
  Administered 2016-08-07: 0.25 mg via INTRAVENOUS

## 2016-08-07 MED ORDER — NALBUPHINE HCL 10 MG/ML IJ SOLN
5.0000 mg | Freq: Once | INTRAMUSCULAR | Status: DC | PRN
  Filled 2016-08-07: qty 1

## 2016-08-07 MED ORDER — CEFAZOLIN SODIUM-DEXTROSE 2-3 GM-% IV SOLR
INTRAVENOUS | Status: DC | PRN
  Administered 2016-08-07: 2 g via INTRAVENOUS

## 2016-08-07 MED ORDER — INSULIN ASPART 100 UNIT/ML ~~LOC~~ SOLN: 0.0000 [IU] | SUBCUTANEOUS | Status: DC

## 2016-08-07 MED ORDER — FENTANYL CITRATE (PF) 100 MCG/2ML IJ SOLN
INTRAMUSCULAR | Status: AC
  Filled 2016-08-07: qty 2

## 2016-08-07 MED ORDER — SIMETHICONE 80 MG PO CHEW
80.0000 mg | CHEWABLE_TABLET | ORAL | Status: DC
  Administered 2016-08-07 – 2016-08-09 (×2): 80 mg via ORAL
  Filled 2016-08-07 (×3): qty 1

## 2016-08-07 MED ORDER — SCOPOLAMINE 1 MG/3DAYS TD PT72: 1.0000 | MEDICATED_PATCH | Freq: Once | TRANSDERMAL | Status: DC

## 2016-08-07 MED ORDER — MORPHINE SULFATE (PF) 0.5 MG/ML IJ SOLN
INTRAMUSCULAR | Status: AC
  Filled 2016-08-07: qty 10

## 2016-08-07 MED ORDER — PHENYLEPHRINE 8 MG IN D5W 100 ML (0.08MG/ML) PREMIX OPTIME
INJECTION | INTRAVENOUS | Status: AC
  Filled 2016-08-07: qty 100

## 2016-08-07 MED ORDER — PHENYLEPHRINE 8 MG IN D5W 100 ML (0.08MG/ML) PREMIX OPTIME
INJECTION | INTRAVENOUS | Status: DC | PRN
  Administered 2016-08-07: 60 ug/min via INTRAVENOUS

## 2016-08-07 MED ORDER — DIPHENHYDRAMINE HCL 50 MG/ML IJ SOLN: 12.5000 mg | INTRAMUSCULAR | Status: DC | PRN

## 2016-08-07 MED ORDER — SIMETHICONE 80 MG PO CHEW
80.0000 mg | CHEWABLE_TABLET | Freq: Three times a day (TID) | ORAL | Status: DC
  Administered 2016-08-08 (×3): 80 mg via ORAL
  Filled 2016-08-07 (×4): qty 1

## 2016-08-07 MED ORDER — NALBUPHINE HCL 10 MG/ML IJ SOLN
5.0000 mg | INTRAMUSCULAR | Status: DC | PRN
  Administered 2016-08-07: 5 mg via INTRAVENOUS

## 2016-08-07 MED ORDER — MORPHINE SULFATE-NACL 0.5-0.9 MG/ML-% IV SOSY
PREFILLED_SYRINGE | INTRAVENOUS | Status: DC | PRN
  Administered 2016-08-07: .2 mg via INTRATHECAL

## 2016-08-07 MED ORDER — WITCH HAZEL-GLYCERIN EX PADS: 1.0000 "application " | MEDICATED_PAD | CUTANEOUS | Status: DC | PRN

## 2016-08-07 MED ORDER — IBUPROFEN 600 MG PO TABS
600.0000 mg | ORAL_TABLET | Freq: Four times a day (QID) | ORAL | Status: DC
  Administered 2016-08-07 – 2016-08-09 (×7): 600 mg via ORAL
  Filled 2016-08-07 (×8): qty 1

## 2016-08-07 MED ORDER — SCOPOLAMINE 1 MG/3DAYS TD PT72
MEDICATED_PATCH | TRANSDERMAL | Status: DC | PRN
  Administered 2016-08-07: 1 mg via TRANSDERMAL

## 2016-08-07 MED ORDER — SCOPOLAMINE 1 MG/3DAYS TD PT72
MEDICATED_PATCH | TRANSDERMAL | Status: AC
  Filled 2016-08-07: qty 1

## 2016-08-07 MED ORDER — KETOROLAC TROMETHAMINE 30 MG/ML IJ SOLN
INTRAMUSCULAR | Status: AC
  Filled 2016-08-07: qty 1

## 2016-08-07 MED ORDER — OXYTOCIN 10 UNIT/ML IJ SOLN
INTRAMUSCULAR | Status: AC
  Filled 2016-08-07: qty 4

## 2016-08-07 MED ORDER — ONDANSETRON HCL 4 MG/2ML IJ SOLN
INTRAMUSCULAR | Status: AC
  Filled 2016-08-07: qty 2

## 2016-08-07 MED ORDER — NALOXONE HCL 0.4 MG/ML IJ SOLN: 0.4000 mg | INTRAMUSCULAR | Status: DC | PRN

## 2016-08-07 MED ORDER — DIBUCAINE 1 % RE OINT: 1.0000 "application " | TOPICAL_OINTMENT | RECTAL | Status: DC | PRN

## 2016-08-07 MED ORDER — COCONUT OIL OIL: 1.0000 "application " | TOPICAL_OIL | Status: DC | PRN

## 2016-08-07 MED ORDER — PRENATAL MULTIVITAMIN CH
1.0000 | ORAL_TABLET | Freq: Every day | ORAL | Status: DC
  Administered 2016-08-08 – 2016-08-09 (×2): 1 via ORAL
  Filled 2016-08-07 (×2): qty 1

## 2016-08-07 MED ORDER — PREDNISONE 5 MG PO TABS
5.0000 mg | ORAL_TABLET | Freq: Two times a day (BID) | ORAL | Status: DC
  Filled 2016-08-07: qty 1

## 2016-08-07 MED ORDER — OXYTOCIN 10 UNIT/ML IJ SOLN
INTRAVENOUS | Status: DC | PRN
  Administered 2016-08-07: 40 [IU] via INTRAVENOUS

## 2016-08-07 MED ORDER — OXYCODONE HCL 5 MG PO TABS
10.0000 mg | ORAL_TABLET | ORAL | Status: DC | PRN
  Administered 2016-08-08 – 2016-08-09 (×5): 10 mg via ORAL
  Filled 2016-08-07 (×5): qty 2

## 2016-08-07 MED ORDER — NALOXONE HCL 2 MG/2ML IJ SOSY: 1.0000 ug/kg/h | PREFILLED_SYRINGE | INTRAMUSCULAR | Status: DC | PRN

## 2016-08-07 MED ORDER — MEPERIDINE HCL 25 MG/ML IJ SOLN
6.2500 mg | INTRAMUSCULAR | Status: DC | PRN
Start: 2016-08-07 — End: 2016-08-07

## 2016-08-07 SURGICAL SUPPLY — 35 items
APL SKNCLS STERI-STRIP NONHPOA (GAUZE/BANDAGES/DRESSINGS) ×2
BENZOIN TINCTURE PRP APPL 2/3 (GAUZE/BANDAGES/DRESSINGS) ×4 IMPLANT
CHLORAPREP W/TINT 26ML (MISCELLANEOUS) ×4 IMPLANT
CLAMP CORD UMBIL (MISCELLANEOUS) IMPLANT
CLIP FILSHIE TUBAL LIGA STRL (Clip) ×3 IMPLANT
CLOSURE STERI STRIP 1/2 X4 (GAUZE/BANDAGES/DRESSINGS) ×2 IMPLANT
CLOSURE WOUND 1/2 X4 (GAUZE/BANDAGES/DRESSINGS) ×1
CLOTH BEACON ORANGE TIMEOUT ST (SAFETY) ×4 IMPLANT
DRSG OPSITE POSTOP 4X10 (GAUZE/BANDAGES/DRESSINGS) ×4 IMPLANT
ELECT REM PT RETURN 9FT ADLT (ELECTROSURGICAL) ×4
ELECTRODE REM PT RTRN 9FT ADLT (ELECTROSURGICAL) ×2 IMPLANT
EXTRACTOR VACUUM M CUP 4 TUBE (SUCTIONS) IMPLANT
EXTRACTOR VACUUM M CUP 4' TUBE (SUCTIONS)
GLOVE BIOGEL PI IND STRL 7.0 (GLOVE) ×4 IMPLANT
GLOVE BIOGEL PI IND STRL 7.5 (GLOVE) ×4 IMPLANT
GLOVE BIOGEL PI INDICATOR 7.0 (GLOVE) ×4
GLOVE BIOGEL PI INDICATOR 7.5 (GLOVE) ×4
GLOVE ECLIPSE 7.5 STRL STRAW (GLOVE) ×4 IMPLANT
GOWN STRL REUS W/TWL LRG LVL3 (GOWN DISPOSABLE) ×12 IMPLANT
KIT ABG SYR 3ML LUER SLIP (SYRINGE) ×3 IMPLANT
NDL HYPO 25X5/8 SAFETYGLIDE (NEEDLE) IMPLANT
NEEDLE HYPO 25X5/8 SAFETYGLIDE (NEEDLE) ×4 IMPLANT
NS IRRIG 1000ML POUR BTL (IV SOLUTION) ×4 IMPLANT
PACK C SECTION WH (CUSTOM PROCEDURE TRAY) ×4 IMPLANT
PAD OB MATERNITY 4.3X12.25 (PERSONAL CARE ITEMS) ×4 IMPLANT
PENCIL SMOKE EVAC W/HOLSTER (ELECTROSURGICAL) ×4 IMPLANT
RTRCTR C-SECT PINK 25CM LRG (MISCELLANEOUS) ×4 IMPLANT
STRIP CLOSURE SKIN 1/2X4 (GAUZE/BANDAGES/DRESSINGS) ×3 IMPLANT
SUT VIC AB 0 CTX 36 (SUTURE) ×12
SUT VIC AB 0 CTX36XBRD ANBCTRL (SUTURE) ×6 IMPLANT
SUT VIC AB 2-0 CT1 27 (SUTURE) ×4
SUT VIC AB 2-0 CT1 TAPERPNT 27 (SUTURE) ×2 IMPLANT
SUT VIC AB 4-0 KS 27 (SUTURE) ×4 IMPLANT
TOWEL OR 17X24 6PK STRL BLUE (TOWEL DISPOSABLE) ×4 IMPLANT
TRAY FOLEY BAG SILVER LF 14FR (SET/KITS/TRAYS/PACK) ×4 IMPLANT

## 2016-08-07 NOTE — Anesthesia Preprocedure Evaluation (Addendum)
Anesthesia Evaluation  Patient identified by MRN, date of birth, ID band Patient awake    Reviewed: Allergy & Precautions, NPO status , Patient's Chart, lab work & pertinent test results  Airway Mallampati: II  TM Distance: >3 FB Neck ROM: Full    Dental no notable dental hx. (+) Dental Advisory Given   Pulmonary Current Smoker,    Pulmonary exam normal        Cardiovascular negative cardio ROS Normal cardiovascular exam     Neuro/Psych PSYCHIATRIC DISORDERS Depression negative neurological ROS     GI/Hepatic negative GI ROS, Neg liver ROS,   Endo/Other  negative endocrine ROS  Renal/GU negative Renal ROS  negative genitourinary   Musculoskeletal negative musculoskeletal ROS (+)   Abdominal   Peds negative pediatric ROS (+)  Hematology negative hematology ROS (+)   Anesthesia Other Findings   Reproductive/Obstetrics (+) Pregnancy                            Anesthesia Physical Anesthesia Plan  ASA: II  Anesthesia Plan: Spinal   Post-op Pain Management:    Induction:   PONV Risk Score and Plan: 1 and Ondansetron and Scopolamine patch - Pre-op  Airway Management Planned: Natural Airway  Additional Equipment:   Intra-op Plan:   Post-operative Plan:   Informed Consent: I have reviewed the patients History and Physical, chart, labs and discussed the procedure including the risks, benefits and alternatives for the proposed anesthesia with the patient or authorized representative who has indicated his/her understanding and acceptance.   Dental advisory given  Plan Discussed with: Anesthesiologist, CRNA and Surgeon  Anesthesia Plan Comments:        Anesthesia Quick Evaluation

## 2016-08-07 NOTE — Progress Notes (Signed)
Came in to offer pt spiritual care support.  Pt was asleep.  Will attempt at a later time.  Chaplain Dyanne CarrelKaty Shyan Scalisi, Bcc Pager, [phone] 1:40 PM

## 2016-08-07 NOTE — Progress Notes (Signed)
Pt requesting a new nurse. Luanna ColeBriana charge nurse notified. This nurse's assignment has ended. Carmelina DaneERRI L Samanvitha Germany, RN

## 2016-08-07 NOTE — Progress Notes (Signed)
Pt being verbal aggressive and combative. Trying to explain plan of care with patient. Pt states that she is going to "do what she wants" and that we are wasting our time. Dr. Vergie LivingPickens called and notified of behavior and that patient would like to speak to him. Carmelina DaneERRI L Covey Baller, RN

## 2016-08-07 NOTE — Progress Notes (Signed)
EFM removed secondary U/S tech @ bedside performing bedside U/S.

## 2016-08-07 NOTE — Op Note (Signed)
Cesarean Section Operative Report  [name]  PROCEDURE DATE: 08/07/2016  PREOPERATIVE DIAGNOSES: Intrauterine pregnancy at [redacted]w[redacted]d weeks gestation; malpresentation: frank breech and non-reassuring fetal status; undesired fertility  POSTOPERATIVE DIAGNOSES: The same  PROCEDURE: Repeat Low Transverse Cesarean Section with bilateral tubal ligation using Filshie Clips  SURGEON:   Surgeon(s) and Role:    * Levie Heritage, DO - Primary    * Mumaw, Hiram Comber, DO - [place] Fellow  ASSISTANT:  Jen Mow, DO - OB Fellow   INDICATIONS: Caroline Bishop is a 41 y.o. [phone] at [redacted]w[redacted]d here for cesarean section secondary to the indications listed under preoperative diagnoses; please see preoperative note for further details. The risks of cesarean section were discussed with the patient including but were not limited to: bleeding which may require transfusion or reoperation; infection which may require antibiotics; injury to bowel, bladder, ureters or other surrounding organs; injury to the fetus; need for additional procedures including hysterectomy in the event of a life-threatening hemorrhage; placental abnormalities wth subsequent pregnancies, incisional problems, thromboembolic phenomenon and other postoperative/anesthesia complications.  Patient also desires permanent sterilization.  Other reversible forms of contraception were discussed with patient; she declines all other modalities. Risks of procedure discussed with patient including but not limited to: risk of regret, permanence of method, bleeding, infection, injury to surrounding organs and need for additional procedures.  Failure risk of 1-2 % with increased risk of ectopic gestation if pregnancy occurs was also discussed with patient.  Patient verbalized understanding of these risks and wants to proceed with sterilization during cesarean section.  The patient concurred with the proposed plan, giving informed written consent for  the procedure.    FINDINGS:  Viable female infant in cephalic presentation.  Apgars 5 and 8. No amniotic fluid.  Intact placenta, three vessel cord.  Normal uterus, fallopian tubes and ovaries bilaterally.  ANESTHESIA: Spinal INTRAVENOUS FLUIDS: 1200 ml ESTIMATED BLOOD LOSS: 500 ml URINE OUTPUT:  100 ml SPECIMENS: Placenta sent to pathology COMPLICATIONS: None immediate  PROCEDURE IN DETAIL:  The patient preoperatively received intravenous antibiotics and had sequential compression devices applied to her lower extremities.  She was then taken to the operating room where spinal anesthesia was administered and was found to be adequate. She was then placed in a dorsal supine position with a leftward tilt, and prepped and draped in a sterile manner.  A foley catheter was placed into her bladder and attached to constant gravity.    After an adequate timeout was performed, a Pfannenstiel skin incision was made with scalpel over her preexisting scar and carried through to the underlying layer of fascia. The fascia was incised in the midline, and this incision was extended bilaterally using the Mayo scissors.  Kocher clamps were applied to the superior aspect of the fascial incision and the underlying rectus muscles were dissected off bluntly.  A similar process was carried out on the inferior aspect of the fascial incision. The rectus muscles were separated in the midline bluntly and the peritoneum was entered bluntly. Alexis retractor was placed. Attention was turned to the lower uterine segment where a low transverse hysterotomy was made with a scalpel and extended bilaterally bluntly. No amniotic fluid was noted, and presenting part was buttock. Infant delivered in frank breech presentation in usual fashion without complication. The infant was successfully delivered, the cord was clamped and cut after 15 seconds, and the infant was handed over to the awaiting neonatology team. Uterine massage was then  administered, and the placenta  delivered intact with a three-vessel cord. The uterus was then cleared of clots and debris.  The hysterotomy was closed with 0 Vicryl in a running locked fashion, no imbricating layer was needed. The pelvis was cleared of all clot and debris. Hemostasis was confirmed on all surfaces.  The left fallopian tube was identified and followed out to the fimbriated end.  A Filshie clip was placed on the left fallopian tube about 2 cm from the cornual attachment, with care given to incorporate the underlying mesosalpinx.  A similar process was carried out on the right side allowing for bilateral tubal sterilization.  Good hemostasis was noted overall.   The peritoneum and rectus muscles were reapproximated with a 0 Vicryl running stitch. The fascia was then closed using 0 Vicryl in a running fashion.  The subcutaneous layer was irrigated.  The skin was closed with a 4-0 Vicryl subcuticular stitch.   The patient tolerated the procedure well. Sponge, lap, instrument and needle counts were correct x 3.  She was taken to the recovery room in stable condition.     Disposition: PACU - hemodynamically stable.   Maternal Condition: stable  Baby to NICU    Signed: Jen MowElizabeth Mumaw, DO OB Fellow 08/07/2016 5:42 PM

## 2016-08-07 NOTE — Progress Notes (Addendum)
Daily Antepartum Note  Admission Date: 08/06/2016 Current Date: 08/07/2016 7:43 AM  Caroline Bishop is a 41 y.o. [id number] @ [redacted]w[redacted]d, HD#2, admitted for PPROM @ 33/1.  Pregnancy complicated by: Breech presentation H/o substance abuse Limited PNC ?lupus H/o VBAC AMA H/o HSV2 Tobacco abuse Overnight/24hr events:  See prior notes.nothing o/n  Subjective:  Pt states UCs still feel spaced out and not worsening. No fevers, chills, pt anxious about coming off Mg.   Objective:    Current Vital Signs 24h Vital Sign Ranges  T 98.1 F (36.7 C) Temp  Avg: 98.4 F (36.9 C)  Min: 98 F (36.7 C)  Max: 99 F (37.2 C)  BP 115/71 BP  Min: 100/58  Max: 141/73  HR 90 Pulse  Avg: 108.7  Min: 90  Max: 142  RR 18 Resp  Avg: 18.7  Min: 16  Max: 20  SaO2 99 % Not Delivered SpO2  Avg: 99.7 %  Min: 99 %  Max: 100 %       24 Hour I/O Current Shift I/O  Time Ins Outs 06/04 0701 - 06/05 0700 In: 4364.6 [P.O.:2400; I.V.:1814.6] Out: 4100 [Urine:4100] No intake/output data recorded.  UOP: >161mL/hr  135 baseline, +accels, rare slight variables, mod variability Toco: quiet  Physical exam: General: Well nourished, well developed female in no acute distress. Abdomen: gravid nttp Cardiovascular: S1, S2 normal, no murmur, rub or gallop, regular rate and rhythm Respiratory: CTAB Extremities: no clubbing, cyanosis or edema Skin: Warm and dry.   Medications: Current Facility-Administered Medications  Medication Dose Route Frequency Provider Last Rate Last Dose  . acetaminophen (TYLENOL) tablet 650 mg  650 mg Oral Q4H PRN Constant, Peggy, MD   650 mg at 08/06/16 2125  . ampicillin (OMNIPEN) 2 g in sodium chloride 0.9 % 50 mL IVPB  2 g Intravenous Q6H Constant, Peggy, MD   Stopped at 08/07/16 0513   Followed by  . [START ON 08/08/2016] amoxicillin (AMOXIL) capsule 500 mg  500 mg Oral Q8H Constant, Peggy, MD      . azithromycin (ZITHROMAX) tablet 1,000 mg  1,000 mg Oral Once Constant, Peggy, MD      .  calcium carbonate (TUMS - dosed in mg elemental calcium) chewable tablet 400 mg of elemental calcium  2 tablet Oral Q4H PRN Constant, Peggy, MD      . hydrALAZINE (APRESOLINE) injection 10 mg  10 mg Intravenous Once PRN [place] Bing, MD      . lactated ringers infusion   Intravenous Continuous [place] Bing, MD 75 mL/hr at 08/06/16 1700    . magnesium sulfate 40 grams in LR 500 mL OB infusion  2 g/hr Intravenous Continuous [place] Bing, MD 25 mL/hr at 08/07/16 0600 2 g/hr at 08/07/16 0600  . predniSONE (DELTASONE) tablet 5 mg  5 mg Oral BID [place] Bing, MD   5 mg at 08/06/16 2200  . prenatal multivitamin tablet 1 tablet  1 tablet Oral Q1200 Constant, Peggy, MD      . valACYclovir (VALTREX) tablet 1,000 mg  1,000 mg Oral Daily [place] Bing, MD   1,000 mg at 08/06/16 1052    Labs:   a1c pending  Results for [name], [name] (MRN [id number]) as of 08/07/2016 07:41  Ref. Range 08/06/2016 11:06 08/06/2016 12:59 08/06/2016 13:05 08/06/2016 19:11 08/07/2016 06:13  Glucose-Capillary Latest Ref Range: 65 - 99 mg/dL 865 (H) 784 (H) 696 (H) 111 (H) 131 (H)    Radiology: no new imaging  Assessment & Plan:  Pt stable *PPROM:  continue latency abx. Will d/c Mg since BMZ #2 just given and leave on EFM for a few hours to see if uterine activity starts back up again. Pt told to be NPO while on EFM given she is breech and if she starts to labor. F/u growth u/s for later today *Breech presentation: see above. D/w pt need for c-section if labors/time for delivery *Preterm: NICU aware. S/p BMZ on 6/4 and 6/5 *H/o substance abuse: admit UDS neg *limited PNC: hasn't been in clinic to get GDM screening. a1c ordered. Continue with BS checks.  *?lupus: pt states she doesn't have this as an official dx but is on bid po prednisone for the past few years to help with myalgias. [facility]. Labs ordered.  *H/o VBAC: will need rpt c-section. 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   Caroline Bishop, [name] MD Attending Center for [facility](Faculty Practice)

## 2016-08-07 NOTE — Anesthesia Procedure Notes (Signed)
Spinal  Patient location during procedure: OR Start time: 08/07/2016 4:27 PM End time: 08/07/2016 4:36 PM Staffing Anesthesiologist: Heather RobertsSINGER, Yilia Sacca Performed: anesthesiologist  Preanesthetic Checklist Completed: patient identified, surgical consent, pre-op evaluation, timeout performed, IV checked, risks and benefits discussed and monitors and equipment checked Spinal Block Patient position: sitting Prep: DuraPrep Patient monitoring: cardiac monitor, continuous pulse ox and blood pressure Approach: midline Location: L2-3 Injection technique: single-shot Needle Needle type: Pencan  Needle gauge: 24 G Needle length: 9 cm Additional Notes Functioning IV was confirmed and monitors were applied. Sterile prep and drape, including hand hygiene and sterile gloves were used. The patient was positioned and the spine was prepped. The skin was anesthetized with lidocaine.  Free flow of clear CSF was obtained prior to injecting local anesthetic into the CSF.  The spinal needle aspirated freely following injection.  The needle was carefully withdrawn.  The patient tolerated the procedure well.

## 2016-08-07 NOTE — Progress Notes (Signed)
Spoke with Dr. Vergie LivingPickens about morning BS of 131. Pt is being non compliant with eating and the BS is not a true fasting. Dr. Vergie LivingPickens instructed not to provide insulin coverage at this time Caroline DaneERRI L Fahima Cifelli, RN

## 2016-08-07 NOTE — Progress Notes (Signed)
Patient ID: Caroline Bishop, female   DOB: 02/07/1976, 41 y.o.   MRN: [id number]  Called regarding patient's BPP for NRFHT (no accels, variable decels and periods of minimal variability). BPP 2/8. Pt NPO >8hrs. Will proceed with delivery.   The risks of cesarean section discussed with the patient included but were not limited to: bleeding which may require transfusion or reoperation; infection which may require antibiotics; injury to bowel, bladder, ureters or other surrounding organs; injury to the fetus; need for additional procedures including hysterectomy in the event of a life-threatening hemorrhage; placental abnormalities wth subsequent pregnancies, incisional problems, thromboembolic phenomenon and other postoperative/anesthesia complications. The patient concurred with the proposed plan, giving informed written consent for the procedure.   Patient has been NPO since last night, she will remain NPO for procedure. Anesthesia and OR aware.  Preoperative prophylactic Ancef ordered on call to the OR.  To OR when ready.  Levie HeritageStinson, Miliano Cotten J, DO 08/07/2016 4:14 PM

## 2016-08-07 NOTE — Transfer of Care (Signed)
Immediate Anesthesia Transfer of Care Note  Patient: Caroline Bishop  Procedure(s) Performed: Procedure(s): CESAREAN SECTION WITH BILATERAL TUBAL LIGATION (N/A)  Patient Location: PACU  Anesthesia Type:Spinal  Level of Consciousness: awake  Airway & Oxygen Therapy: Patient Spontanous Breathing  Post-op Assessment: Report given to RN  Post vital signs: Reviewed  Last Vitals:  Vitals:   08/07/16 1219 08/07/16 1600  BP: 115/64 125/78  Pulse: (!) 103 79  Resp: 16   Temp: 36.9 C 37 C    Last Pain:  Vitals:   08/07/16 1600  TempSrc: Oral  PainSc:       Patients Stated Pain Goal: 4 (08/06/16 0744)  Complications: stable, no apparent complications

## 2016-08-08 DIAGNOSIS — Z98891 History of uterine scar from previous surgery: Secondary | ICD-10-CM

## 2016-08-08 LAB — CULTURE, BETA STREP (GROUP B ONLY)

## 2016-08-08 LAB — CBC
HEMATOCRIT: 29.2 % — AB (ref 36.0–46.0)
Hemoglobin: 9.9 g/dL — ABNORMAL LOW (ref 12.0–15.0)
MCH: 31.2 pg (ref 26.0–34.0)
MCHC: 33.9 g/dL (ref 30.0–36.0)
MCV: 92.1 fL (ref 78.0–100.0)
Platelets: 201 10*3/uL (ref 150–400)
RBC: 3.17 MIL/uL — ABNORMAL LOW (ref 3.87–5.11)
RDW: 15.3 % (ref 11.5–15.5)
WBC: 25.1 10*3/uL — AB (ref 4.0–10.5)

## 2016-08-08 LAB — SJOGRENS SYNDROME-A EXTRACTABLE NUCLEAR ANTIBODY: SSA (Ro) (ENA) Antibody, IgG: <0.2 AI (ref 0.0–0.9)

## 2016-08-08 LAB — CARDIOLIPIN ANTIBODIES, IGM+IGG: Anticardiolipin IgG: <9 GPL U/mL (ref 0–14)

## 2016-08-08 LAB — GLUCOSE, RANDOM: GLUCOSE: 128 mg/dL — AB (ref 65–99)

## 2016-08-08 LAB — C4 COMPLEMENT: COMPLEMENT C4, BODY FLUID: 43 mg/dL (ref 14–44)

## 2016-08-08 LAB — C3 COMPLEMENT: C3 COMPLEMENT: 150 mg/dL (ref 82–167)

## 2016-08-08 LAB — SJOGRENS SYNDROME-B EXTRACTABLE NUCLEAR ANTIBODY: SSB (La) (ENA) Antibody, IgG: <0.2 AI (ref 0.0–0.9)

## 2016-08-08 LAB — HEMOGLOBIN A1C
HEMOGLOBIN A1C: 5.6 % (ref 4.8–5.6)
MEAN PLASMA GLUCOSE: 114 mg/dL

## 2016-08-08 LAB — ANTI-DNA ANTIBODY, DOUBLE-STRANDED: ds DNA Ab: 1 IU/mL (ref 0–9)

## 2016-08-08 NOTE — Anesthesia Postprocedure Evaluation (Signed)
Anesthesia Post Note  Patient: Caroline Bishop  Procedure(s) Performed: Procedure(s) (LRB): CESAREAN SECTION WITH BILATERAL TUBAL LIGATION (N/A)     Patient location during evaluation: Mother Baby Anesthesia Type: Spinal Level of consciousness: awake and alert Pain management: pain level not controlled Vital Signs Assessment: post-procedure vital signs reviewed and stable Respiratory status: spontaneous breathing Cardiovascular status: blood pressure returned to baseline Postop Assessment: no headache, patient able to bend at knees, no backache, no signs of nausea or vomiting, adequate PO intake and spinal receding Anesthetic complications: no    Last Vitals:  Vitals:   08/08/16 0116 08/08/16 0400  BP: 101/73 132/76  Pulse: 68 64  Resp: 18 18  Temp: 36.8 C 36.8 C    Last Pain:  Vitals:   08/08/16 0400  TempSrc: Oral  PainSc:    Pain Goal: Patients Stated Pain Goal: 4 (08/06/16 0744)               Salome ArntSterling, Anjoli Diemer Marie

## 2016-08-08 NOTE — Progress Notes (Signed)
I introduced spiritual care services to pt, who was hoping to take a nap.  She declined speaking more at this time, but she was very appreciative of me checking in on her.  She reported good support and she feels so grateful and relieved that her baby is doing okay.  Chaplain Dyanne CarrelKaty Raye Slyter, Bcc Pager, [phone] 4:20 PM    08/08/16 1600  Clinical Encounter Type  Visited With Patient  Visit Type Initial

## 2016-08-08 NOTE — Progress Notes (Signed)
Post Partum Day 2 Subjective:  Caroline Bishop is a 41 y.o. [id number] 1250w2d s/p rLTCS.  No acute events overnight.  Pt denies problems with ambulating, voiding or po intake.  She denies nausea or vomiting.  Pain is well controlled.  She has had flatus.  Lochia Small.  Plan for birth control is undecided.  Method of Feeding: Formula  Objective: Blood pressure (!) 153/85, pulse (!) 58, temperature 98.3 F (36.8 C), temperature source Oral, resp. rate 18, height 5\' 6"  (1.676 m), weight 166 lb (75.3 kg), last menstrual period 12/18/2015, SpO2 100 %, unknown if currently breastfeeding.  Physical Exam:  General: alert, cooperative and no distress Lochia:normal flow Chest: normal WOB Heart: Regular rate Abdomen: +BS, soft, mild TTP (appropriate) Uterine Fundus: firm Incision: c/d/i DVT Evaluation: No evidence of DVT seen on physical exam. Extremities: no edema   Recent Labs  08/06/16 0624 08/08/16 0611  HGB 12.4 9.9*  HCT 36.1 29.2*    Assessment/Plan:  ASSESSMENT: Caroline BeatLukeshia L Thoman is a 41 y.o. [id number] 8350w2d s/p rLTCS  Plan for discharge tomorrow and Social Work consult Continue routine PP care Breastfeeding support PRN  LOS: 2 days   Federico FlakeKimberly Niles Vinton Layson 08/08/2016, 3:34 PM

## 2016-08-08 NOTE — Progress Notes (Signed)
Pt back in room from visiting infant in NICU.

## 2016-08-08 NOTE — Lactation Note (Signed)
This note was copied from a baby's chart. Lactation Consultation Note  Patient Name: Caroline Bishop   NICU baby 6717 hours old. MOB's bedside nurse, Sunny SchleinFelicia, RN reports that mom does not want to BF/pump for EBM.   Maternal Data    Feeding    LATCH Score/Interventions                      Lactation Tools Discussed/Used     Consult Status      Sherlyn HayJennifer D Reyden Smith Bishop, 10:41 AM

## 2016-08-09 ENCOUNTER — Encounter (HOSPITAL_COMMUNITY): Payer: Self-pay | Admitting: Family Medicine

## 2016-08-09 LAB — BETA-2-GLYCOPROTEIN I ABS, IGG/M/A
Beta-2 Glyco I IgG: <9 GPI IgG units (ref 0–20)
Beta-2-Glycoprotein I IgA: <9 GPI IgA units (ref 0–25)

## 2016-08-09 LAB — LUPUS ANTICOAGULANT
DRVVT: 53.8 s — ABNORMAL HIGH (ref 0.0–47.0)
PTT LA: 42.7 s (ref 0.0–51.9)
THROMBIN TIME: 15.1 s (ref 0.0–23.0)
dPT Confirm Ratio: 1.2 Ratio (ref 0.00–1.40)
dPT: 53.2 s (ref 0.0–55.0)

## 2016-08-09 LAB — DRVVT MIX: dRVVT Mix: 41.8 s (ref 0.0–47.0)

## 2016-08-09 MED ORDER — OXYCODONE HCL 5 MG PO TABS: 5.0000 mg | ORAL_TABLET | ORAL | 0 refills | Status: DC | PRN

## 2016-08-09 MED ORDER — HYDROCHLOROTHIAZIDE 25 MG PO TABS: 25.0000 mg | ORAL_TABLET | Freq: Every day | ORAL | 3 refills | Status: DC

## 2016-08-09 MED ORDER — IBUPROFEN 600 MG PO TABS: 600.0000 mg | ORAL_TABLET | Freq: Four times a day (QID) | ORAL | 0 refills | Status: AC

## 2016-08-09 MED ORDER — PREDNISONE 5 MG PO TABS: 5.0000 mg | ORAL_TABLET | Freq: Two times a day (BID) | ORAL | 0 refills | Status: DC

## 2016-08-09 NOTE — Discharge Summary (Addendum)
OB Discharge Summary  Patient Name: Caroline Bishop DOB: 07/16/1975 MRN: [id number]  Date of admission: 08/06/2016 Delivering MD: Levie HeritageSTINSON, JACOB J   Date of discharge: 08/09/2016  Admitting diagnosis: 33 WEEKS ROM Intrauterine pregnancy: 8615w2d     Secondary diagnosis:Principal Problem:   S/P repeat low transverse C-section Active Problems:   Lupus anticoagulant positive   Supervision of high-risk pregnancy   Herpes simplex type 2 infection   AMA (advanced maternal age) multigravida 35+   Smoker   History of preterm delivery, currently pregnant   Drug abuse during pregnancy   Abnormal chromosomal and genetic finding on antenatal screening mother   Preterm premature rupture of membranes (PPROM) with unknown onset of labor   Hyperglycemia  Additional problems:None     Discharge diagnosis: Preterm Pregnancy Delivered, Gestational Hypertension, Anemia and limited PNC                                                                     Post partum procedures:none  Augmentation: none  Complications: ROM>24 hours  Hospital course:  Onset of Labor With Unplanned C/S  41 y.o. yo [id number] at 2815w2d was admitted with PPROM at 3733 1/7 wk on 08/06/2016. . Membrane Rupture Time/Date: 5:20 AM ,08/06/2016  Patient admitted and received BMZ and latency Abx. She was continued begun on stress dose prednisone, due to chronic use due to SLE. (labs normal here) Patient with BPP of 2/10 on day of delivery and proceeded to abdominal delivery The patient went for cesarean section due to Malpresentation and Non-Reassuring FHR, and delivered a Viable infant,08/07/2016  Details of operation can be found in separate operative note. Patient had an uncomplicated postpartum course.  She is ambulating,tolerating a regular diet, passing flatus, and urinating well. Her BP was up on day of discharge and she was sent home on HCTZ 25 mg. CBGs monitored in hospital and were mostly within range.  Patient is discharged  home in stable condition 08/09/16.  Physical exam  Vitals:   08/08/16 1609 08/08/16 2100 08/09/16 0800 08/09/16 1200  BP: 115/66 137/75 (!) 152/74 136/83  Pulse: 75 60 69 63  Resp: 16 18 18 18   Temp: 98.2 F (36.8 C) 98.7 F (37.1 C) 98.3 F (36.8 C) 98.5 F (36.9 C)  TempSrc: Oral Oral Oral Oral  SpO2: 100% 99% 100% 100%  Weight:      Height:       General: alert, cooperative and no distress Lochia: appropriate Uterine Fundus: firm Incision: Healing well with no significant drainage DVT Evaluation: No evidence of DVT seen on physical exam. Labs: Lab Results  Component Value Date   WBC 25.1 (H) 08/08/2016   HGB 9.9 (L) 08/08/2016   HCT 29.2 (L) 08/08/2016   MCV 92.1 08/08/2016   PLT 201 08/08/2016   CMP Latest Ref Rng & Units 08/08/2016  Glucose 65 - 99 mg/dL 696(E128(H)  BUN 6 - 24 mg/dL -  Creatinine 9.520.57 - 8.411.00 mg/dL -  Sodium 324134 - 401144 mmol/L -  Potassium 3.5 - 5.2 mmol/L -  Chloride 96 - 106 mmol/L -  CO2 18 - 29 mmol/L -  Calcium 8.7 - 10.2 mg/dL -  Total Protein 6.0 - 8.5 g/dL -  Total Bilirubin  0.0 - 1.2 mg/dL -  Alkaline Phos 39 - 161 IU/L -  AST 0 - 40 IU/L -  ALT 0 - 32 IU/L -    Discharge instruction: per After Visit Summary and "Baby and Me Booklet".  After Visit Meds:  Allergies as of 08/09/2016   No Known Allergies     Medication List    TAKE these medications   CITRANATAL ASSURE 35-1 & 300 MG tablet One tablet and one capsule daily   ferrous sulfate 325 (65 FE) MG tablet Take 1 tablet (325 mg total) by mouth 2 (two) times daily with a meal.   hydrochlorothiazide 25 MG tablet Commonly known as:  HYDRODIURIL Take 1 tablet (25 mg total) by mouth daily.   ibuprofen 600 MG tablet Commonly known as:  ADVIL,MOTRIN Take 1 tablet (600 mg total) by mouth every 6 (six) hours.   oxyCODONE 5 MG immediate release tablet Commonly known as:  Oxy IR/ROXICODONE Take 1 tablet (5 mg total) by mouth every 4 (four) hours as needed (pain scale 4-7).    predniSONE 5 MG tablet Commonly known as:  DELTASONE Take 1 tablet (5 mg total) by mouth 2 (two) times daily with a meal. Take 2 or 10 mg twice daily for 3 days, then resume 5 mg twice daily What changed:  additional instructions       Diet: routine diet  Activity: Advance as tolerated. Pelvic rest for 6 weeks.   Outpatient follow up:1 wk for BP check Follow up Appt:Future Appointments Date Time Provider Department Center  08/16/2016 3:30 PM Tilda Burrow, MD [facility]  09/20/2016 3:30 PM Cresenzo-Dishmon, Scarlette Calico, CNM [facility]   Follow up visit: No Follow-up on file.  Postpartum contraception: Tubal Ligation  Newborn Data: Live born female  Birth Weight: 4 lb 0.9 oz (1840 g) APGAR: 5, 8  Baby Feeding: Bottle Disposition:NICU   08/09/2016 Reva Bores, MD

## 2016-08-09 NOTE — Discharge Instructions (Signed)
Cesarean Delivery, Care After °Refer to this sheet in the next few weeks. These instructions provide you with information about caring for yourself after your procedure. Your health care provider may also give you more specific instructions. Your treatment has been planned according to current medical practices, but problems sometimes occur. Call your health care provider if you have any problems or questions after your procedure. °What can I expect after the procedure? °After the procedure, it is common to have: °· A small amount of blood or clear fluid coming from the incision. °· Some redness, swelling, and pain in your incision area. °· Some abdominal pain and soreness. °· Vaginal bleeding (lochia). °· Pelvic cramps. °· Fatigue. ° °Follow these instructions at home: °Incision care ° °· Follow instructions from your health care provider about how to take care of your incision. Make sure you: °? Wash your hands with soap and water before you change your bandage (dressing). If soap and water are not available, use hand sanitizer. °? Change your dressing as told by your health care provider. °? Leave stitches (sutures), skin staples, skin glue, or adhesive strips in place. These skin closures may need to stay in place for 2 weeks or longer. If adhesive strip edges start to loosen and curl up, you may trim the loose edges. Do not remove adhesive strips completely unless your health care provider tells you to do that. °· Check your incision area every day for signs of infection. Check for: °? More redness, swelling, or pain. °? More fluid or blood. °? Warmth. °? Pus or a bad smell. °· When you cough or sneeze, hug a pillow. This helps with pain and decreases the chance of your incision opening up (dehiscing). Do this until your incision heals. °Medicines °· Take over-the-counter and prescription medicines only as told by your health care provider. °· If you were prescribed an antibiotic medicine, take it as told by  your health care provider. Do not stop taking the antibiotic until it is finished. °Driving °· Do not drive or operate heavy machinery while taking prescription pain medicine. °· Do not drive for 24 hours if you received a sedative. °Lifestyle °· Do not drink alcohol. This is especially important if you are breastfeeding or taking pain medicine. °· Do not use tobacco products, including cigarettes, chewing tobacco, or e-cigarettes. If you need help quitting, ask your health care provider. Tobacco can delay wound healing. °Eating and drinking °· Drink at least 8 eight-ounce glasses of water every day unless told not to by your health care provider. If you breastfeed, you may need to drink more water than this. °· Eat high-fiber foods every day. These foods may help prevent or relieve constipation. High-fiber foods include: °? Whole grain cereals and breads. °? Brown rice. °? Beans. °? Fresh fruits and vegetables. °Activity °· Return to your normal activities as told by your health care provider. Ask your health care provider what activities are safe for you. °· Rest as much as possible. Try to rest or take a nap while your baby is sleeping. °· Do not lift anything that is heavier than your baby or 10 lb (4.5 kg) as told by your health care provider. °· Ask your health care provider when you can engage in sexual activity. This may depend on your: °? Risk of infection. °? Healing rate. °? Comfort and desire to engage in sexual activity. °Bathing °· Do not take baths, swim, or use a hot tub until your health care   provider approves. Ask your health care provider if you can take showers. You may only be allowed to take sponge baths until your incision heals.  Keep your dressing dry as told by your health care provider. General instructions  Do not use tampons or douches until your health care provider approves.  Wear: ? Loose, comfortable clothing. ? A supportive and well-fitting bra.  Watch for any blood clots  that may pass from your vagina. These may look like clumps of dark red, brown, or black discharge.  Keep your perineum clean and dry as told by your health care provider.  Wipe from front to back when you use the toilet.  If possible, have someone help you care for your baby and help with household activities for a few days after you leave the hospital.  Keep all follow-up visits for you and your baby as told by your health care provider. This is important. Contact a health care provider if:  You have: ? Bad-smelling vaginal discharge. ? Difficulty urinating. ? Pain when urinating. ? A sudden increase or decrease in the frequency of your bowel movements. ? More redness, swelling, or pain around your incision. ? More fluid or blood coming from your incision. ? Pus or a bad smell coming from your incision. ? A fever. ? A rash. ? Little or no interest in activities you used to enjoy. ? Questions about caring for yourself or your baby. ? Nausea.  Your incision feels warm to the touch.  Your breasts turn red or become painful or hard.  You feel unusually sad or worried.  You vomit.  You pass large blood clots from your vagina. If you pass a blood clot, save it to show to your health care provider. Do not flush blood clots down the toilet without showing your health care provider.  You urinate more than usual.  You are dizzy or light-headed.  You have not breastfed and have not had a menstrual period for 12 weeks after delivery.  You stopped breastfeeding and have not had a menstrual period for 12 weeks after stopping breastfeeding. Get help right away if:  You have: ? Pain that does not go away or get better with medicine. ? Chest pain. ? Difficulty breathing. ? Blurred vision or spots in your vision. ? Thoughts about hurting yourself or your baby. ? New pain in your abdomen or in one of your legs. ? A severe headache.  You faint.  You bleed from your vagina so much  that you fill two sanitary pads in one hour. This information is not intended to replace advice given to you by your health care provider. Make sure you discuss any questions you have with your health care provider. Document Released: 11/11/2001 Document Revised: 06/30/2015 Document Reviewed: 01/24/2015 Elsevier Interactive Patient Education  2017 [employer]  DASH Eating Plan DASH stands for "Dietary Approaches to Stop Hypertension." The DASH eating plan is a healthy eating plan that has been shown to reduce high blood pressure (hypertension). It may also reduce your risk for type 2 diabetes, heart disease, and stroke. The DASH eating plan may also help with weight loss. What are tips for following this plan? General guidelines  Avoid eating more than 2,300 mg (milligrams) of salt (sodium) a day. If you have hypertension, you may need to reduce your sodium intake to 1,500 mg a day.  Limit alcohol intake to no more than 1 drink a day for nonpregnant women and 2 drinks a day for  men. One drink equals 12 oz of beer, 5 oz of wine, or 1 oz of hard liquor.  Work with your health care provider to maintain a healthy body weight or to lose weight. Ask what an ideal weight is for you.  Get at least 30 minutes of exercise that causes your heart to beat faster (aerobic exercise) most days of the week. Activities may include walking, swimming, or biking.  Work with your health care provider or diet and nutrition specialist (dietitian) to adjust your eating plan to your individual calorie needs. Reading food labels  Check food labels for the amount of sodium per serving. Choose foods with less than 5 percent of the Daily Value of sodium. Generally, foods with less than 300 mg of sodium per serving fit into this eating plan.  To find whole grains, look for the word "whole" as the first word in the ingredient list. Shopping  Buy products labeled as "low-sodium" or "no salt added."  Buy fresh foods.  Avoid canned foods and premade or frozen meals. Cooking  Avoid adding salt when cooking. Use salt-free seasonings or herbs instead of table salt or sea salt. Check with your health care provider or pharmacist before using salt substitutes.  Do not fry foods. Cook foods using healthy methods such as baking, boiling, grilling, and broiling instead.  Cook with heart-healthy oils, such as olive, canola, soybean, or sunflower oil. Meal planning   Eat a balanced diet that includes: ? 5 or more servings of fruits and vegetables each day. At each meal, try to fill half of your plate with fruits and vegetables. ? Up to 6-8 servings of whole grains each day. ? Less than 6 oz of lean meat, poultry, or fish each day. A 3-oz serving of meat is about the same size as a deck of cards. One egg equals 1 oz. ? 2 servings of low-fat dairy each day. ? A serving of nuts, seeds, or beans 5 times each week. ? Heart-healthy fats. Healthy fats called Omega-3 fatty acids are found in foods such as flaxseeds and coldwater fish, like sardines, salmon, and mackerel.  Limit how much you eat of the following: ? Canned or prepackaged foods. ? Food that is high in trans fat, such as fried foods. ? Food that is high in saturated fat, such as fatty meat. ? Sweets, desserts, sugary drinks, and other foods with added sugar. ? Full-fat dairy products.  Do not salt foods before eating.  Try to eat at least 2 vegetarian meals each week.  Eat more home-cooked food and less restaurant, buffet, and fast food.  When eating at a restaurant, ask that your food be prepared with less salt or no salt, if possible. What foods are recommended? The items listed may not be a complete list. Talk with your dietitian about what dietary choices are best for you. Grains Whole-grain or whole-wheat bread. Whole-grain or whole-wheat pasta. Brown rice. Orpah Cobb. Bulgur. Whole-grain and low-sodium cereals. Pita bread. Low-fat,  low-sodium crackers. Whole-wheat flour tortillas. Vegetables Fresh or frozen vegetables (raw, steamed, roasted, or grilled). Low-sodium or reduced-sodium tomato and vegetable juice. Low-sodium or reduced-sodium tomato sauce and tomato paste. Low-sodium or reduced-sodium canned vegetables. Fruits All fresh, dried, or frozen fruit. Canned fruit in natural juice (without added sugar). Meat and other protein foods Skinless chicken or [place]. Ground chicken or [place]. Pork with fat trimmed off. Fish and seafood. Egg whites. Dried beans, peas, or lentils. Unsalted nuts, nut butters, and seeds. Unsalted canned  beans. Lean cuts of beef with fat trimmed off. Low-sodium, lean deli meat. Dairy Low-fat (1%) or fat-free (skim) milk. Fat-free, low-fat, or reduced-fat cheeses. Nonfat, low-sodium ricotta or cottage cheese. Low-fat or nonfat yogurt. Low-fat, low-sodium cheese. Fats and oils Soft margarine without trans fats. Vegetable oil. Low-fat, reduced-fat, or light mayonnaise and salad dressings (reduced-sodium). Canola, safflower, olive, soybean, and sunflower oils. Avocado. Seasoning and other foods Herbs. Spices. Seasoning mixes without salt. Unsalted popcorn and pretzels. Fat-free sweets. What foods are not recommended? The items listed may not be a complete list. Talk with your dietitian about what dietary choices are best for you. Grains Baked goods made with fat, such as croissants, muffins, or some breads. Dry pasta or rice meal packs. Vegetables Creamed or fried vegetables. Vegetables in a cheese sauce. Regular canned vegetables (not low-sodium or reduced-sodium). Regular canned tomato sauce and paste (not low-sodium or reduced-sodium). Regular tomato and vegetable juice (not low-sodium or reduced-sodium). Rosita Fire. Olives. Fruits Canned fruit in a light or heavy syrup. Fried fruit. Fruit in cream or butter sauce. Meat and other protein foods Fatty cuts of meat. Ribs. Fried meat. Tomasa Blase. Sausage.  Bologna and other processed lunch meats. Salami. Fatback. Hotdogs. Bratwurst. Salted nuts and seeds. Canned beans with added salt. Canned or smoked fish. Whole eggs or egg yolks. Chicken or [place] with skin. Dairy Whole or 2% milk, cream, and half-and-half. Whole or full-fat cream cheese. Whole-fat or sweetened yogurt. Full-fat cheese. Nondairy creamers. Whipped toppings. Processed cheese and cheese spreads. Fats and oils Butter. Stick margarine. Lard. Shortening. Ghee. Bacon fat. Tropical oils, such as coconut, palm kernel, or palm oil. Seasoning and other foods Salted popcorn and pretzels. Onion salt, garlic salt, seasoned salt, table salt, and sea salt. Worcestershire sauce. Tartar sauce. Barbecue sauce. Teriyaki sauce. Soy sauce, including reduced-sodium. Steak sauce. Canned and packaged gravies. Fish sauce. Oyster sauce. Cocktail sauce. Horseradish that you find on the shelf. Ketchup. Mustard. Meat flavorings and tenderizers. Bouillon cubes. Hot sauce and Tabasco sauce. Premade or packaged marinades. Premade or packaged taco seasonings. Relishes. Regular salad dressings. Where to find more information:  National Heart, Lung, and Blood Institute: PopSteam.is  American Heart Association: www.heart.org Summary  The DASH eating plan is a healthy eating plan that has been shown to reduce high blood pressure (hypertension). It may also reduce your risk for type 2 diabetes, heart disease, and stroke.  With the DASH eating plan, you should limit salt (sodium) intake to 2,300 mg a day. If you have hypertension, you may need to reduce your sodium intake to 1,500 mg a day.  When on the DASH eating plan, aim to eat more fresh fruits and vegetables, whole grains, lean proteins, low-fat dairy, and heart-healthy fats.  Work with your health care provider or diet and nutrition specialist (dietitian) to adjust your eating plan to your individual calorie needs. This information is not intended to  replace advice given to you by your health care provider. Make sure you discuss any questions you have with your health care provider. Document Released: 02/08/2011 Document Revised: 02/13/2016 Document Reviewed: 02/13/2016 Elsevier Interactive Patient Education  2017 [employer].

## 2016-08-09 NOTE — Progress Notes (Signed)
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

## 2016-08-09 NOTE — Progress Notes (Signed)
Pt ambulated to  nicu    Teaching complete  

## 2016-08-10 NOTE — H&P (Signed)
Caroline L Harrisonis a [age] y.[id number] at 13102w1d who presents for LOF &contractions. Reports SROM 40 minutes prior to arrival with large gush of clear fluid. Leaking has continued. Reports contractions every 5 minutes since Saturday. Rates pain 7/10. Has taken ibuprofen without relief. [facility]; has missed multiple appointments. Hx of preterm delivery via c/section with subsequent term VBAC. Decreased fetal movement this morning.           OB History   Gravida Para Term Preterm AB Living   6 3 2 1 2 3    SAB TAB Ectopic Multiple Live Births   1 1   3           Past Medical History:  Diagnosis Date  . Lupus anticoagulant complicating pregnancy in first trimester, antepartum [facility](HCC)          Past Surgical History:  Procedure Laterality Date  . CESAREAN SECTION    . CHOLECYSTECTOMY           Family History  Problem Relation Age of Onset  . Lung cancer Mother   . Aneurysm Father    Brain  . Asthma Brother   . Diabetes Maternal Grandmother   . Hypertension Maternal Grandmother           Social History  Substance Use Topics  . Smoking status: Current Every Day Smoker    Packs/day: 0.00    Years: 20.00    Types: Cigarettes  . Smokeless tobacco: Never Used     Comment: smokes a few a day  . Alcohol use No     Comment: stopped with pregnancy    Allergies: No Known Allergies         Prescriptions Prior to Admission  Medication Sig Dispense Refill Last Dose  . ferrous sulfate 325 (65 FE) MG tablet Take 1 tablet (325 mg total) by mouth 2 (two) times daily with a meal. (Patient not taking: Reported on 08/02/2016) 60 tablet 3 Not Taking  . predniSONE (DELTASONE) 5 MG tablet Take 5 mg by mouth 2 (two) times daily with a meal.   Taking  . Prenat w/o A-[id number]-[id number] (CITRANATAL ASSURE) 35-1 & 300 MG tablet One tablet and one capsule daily 60 tablet 11 Taking     Review of Systems  Constitutional: Negative.  Gastrointestinal: Positive for abdominal pain.  Genitourinary: Positive for vaginal discharge. Negative for vaginal bleeding.   Physical Exam   Blood pressure 123/83, pulse (!) 108, temperature 99.2 F (37.3 C), temperature source Oral, resp. rate 18, last menstrual period 12/18/2015, unknown if currently breastfeeding.  Physical Exam Nursing noteand vitalsreviewed. Constitutional: She is oriented to person, place, and time. She appears well-developedand well-nourished. No distress.  HENT:  Head: Normocephalicand atraumatic.  Eyes: Conjunctivaeare normal. Right eye exhibits no discharge. Left eye exhibits no discharge. No scleral icterus.  Neck: Normal range of motion.  Respiratory: Effort normal. No respiratory distress.  GI: Soft.  Ctx palpate moderate Genitourinary:  Genitourinary Comments: Clear fluid actively leaking from vagina. SSE deferred d/t fetal tracing.  Neurological: She is alertand oriented to person, place, and time.  Skin: Skin is warmand dry. She is not diaphoretic.  Psychiatric: She has a normal mood and affect. Her behavior is normal. Judgmentand thought contentnormal.   Dilation: 2.5 Effacement (%): 70 Cervical Position: Anterior Station: -3 Exam by:: Judeth HornErin Lawrence NP  Fetal Tracing:  Baseline: 155 Variability: moderate Accelerations: none Decelerations: recurrent variables  Toco: Q3-5 mins MAU Course  Procedures  Results for orders placed or performed during the hospital encounter of 08/06/16 (from the past 24 hour(s))  POCT fern test Status: Abnormal   Collection Time: 08/06/16 6:20 AM  Result Value Ref Range   POCT Fern Test Positive = ruptured amniotic membanes     MDM Patient is grossly rupture, clear fluid, positive fern slide.  SVE 2.5/70%, unable to determine fetal presentation IV fluid bolus started d/t recurrent variable decelerations Bedside  ultrasound to verify presentation- frank breech  Assessment and Plan  A: 1. Amniotic fluid leaking   2. Determine fetal presentation using ultrasound   3. [redacted] weeks gestation of pregnancy   4. Breech presentation, single or unspecified fetus   5. Variable fetal heart rate decelerations, antepartum    P: Antenatal admission orders -- pt to go to birthing suites until labor has been ruled out BMZ IV fluids Terbutaline x 1 given in MAU Pain management prn Discussed possibility of delivery via c-section if labor and persistent breech Discussed plan for delivery at 34 weeks if no si/sx of chorio prior to 34 weeks Will start latency antibiotics All questions were answered

## 2016-08-16 ENCOUNTER — Other Ambulatory Visit: Payer: Medicaid Other

## 2016-08-16 ENCOUNTER — Encounter: Payer: Medicaid Other | Admitting: Obstetrics and Gynecology

## 2016-08-16 ENCOUNTER — Ambulatory Visit (INDEPENDENT_AMBULATORY_CARE_PROVIDER_SITE_OTHER): Payer: Medicaid Other | Admitting: Obstetrics and Gynecology

## 2016-08-16 ENCOUNTER — Encounter: Payer: Self-pay | Admitting: Obstetrics and Gynecology

## 2016-08-16 VITALS — BP 110/64 | HR 91 | Wt 152.0 lb

## 2016-08-16 DIAGNOSIS — Z4889 Encounter for other specified surgical aftercare: Secondary | ICD-10-CM

## 2016-08-16 NOTE — Progress Notes (Signed)
Patient ID: Caroline Bishop, female   DOB: 06/06/1975, 41 y.o.   MRN: [id number]  Subjective:  Caroline Bishop is a 41 y.o. female now 9 days s/p repeat cesarean section for non reassuring fetal status and breech presentation, with bilateral tubal ligation   She states she and the baby are doing well. She is having intermittent vaginal bleeding.   Review of Systems Negative   Diet:   normal   Bowel movements : normal.  Pain is controlled without any medications.  Objective:  LMP 12/18/2015 (Approximate)  General:Well developed, well nourished.  No acute distress. Abdomen: Bowel sounds normal, soft, non-tender.  Incision(s):   Healing well, no drainage, no erythema, no hernia, no swelling, no dehiscence. Loose steri strips were removed and reapplied.    Assessment:  Post-Op 9 days s/p repeat cesarean section for non reassuring fetal status and breech presentation, with bilateral tubal ligation   Doing well postoperatively.   Plan:  1.Wound care discussed: remove steri strips in ~2w  2. Current medications: none 3. Activity restrictions: no bending, stooping, or squatting and no lifting more than 10 pounds 4. return to work: not applicable. 5. Follow up in 4w post partum check    By signing my name below, I, Doreatha MartinEva Mathews, attest that this documentation has been prepared under the direction and in the presence of Tilda BurrowFerguson, Vickii Volland V, MD. Electronically Signed: Doreatha MartinEva Mathews, ED Scribe. 08/16/16. 3:27 PM.  I personally performed the services described in this documentation, which was SCRIBED in my presence. The recorded information has been reviewed and considered accurate. It has been edited as necessary during review. Tilda BurrowFERGUSON,Louetta Hollingshead V, MD

## 2016-08-20 IMAGING — CT CT MAXILLOFACIAL W/ CM
3 series · 15 of 47 positions shown, 18 images · IV contrast (Omnipaque 300)
Comparison: CT of the head performed 09/26/2011

CLINICAL DATA: Popped bump on nose 4 days ago, with worsening
swelling and erythema, extending about the nose and upper lip.
Initial encounter.

EXAM:
CT MAXILLOFACIAL WITH CONTRAST
TECHNIQUE: Multidetector CT imaging of the maxillofacial structures was
performed with intravenous contrast. Multiplanar CT image
reconstructions were also generated. A small metallic BB was placed
on the right temple in order to reliably differentiate right from
left.
CONTRAST:  80mL OMNIPAQUE IOHEXOL 300 MG/ML  SOLN

[Series 2: max st axial · axial · 0.43mm/px · z∈[+26,+176]mm · 9 of 87 slices shown, 12 images]
[im 6/87  brain]
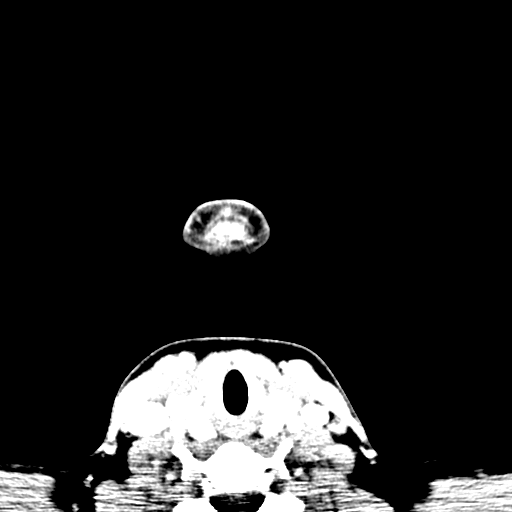
[im 6/87  bone]
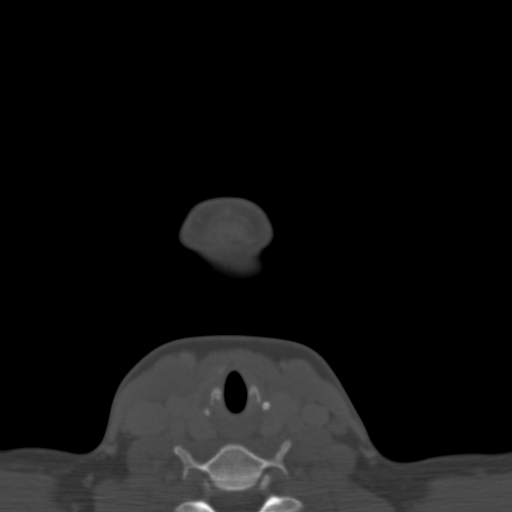
[im 15/87  bone]
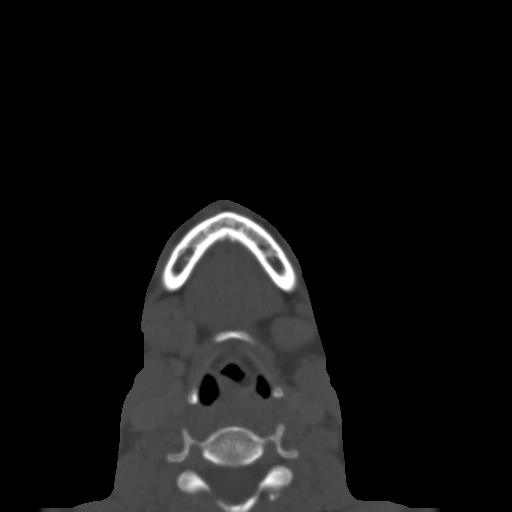
[im 24/87  bone]
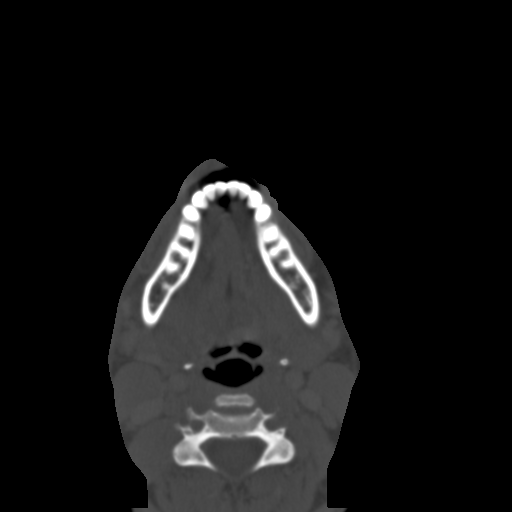
[im 33/87  bone]
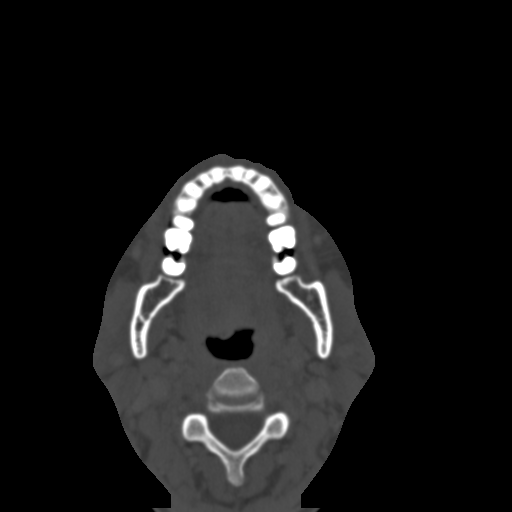
[im 45/87  brain]
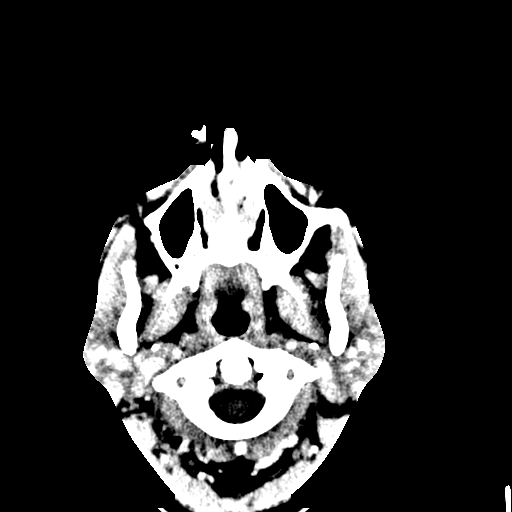
[im 45/87  bone]
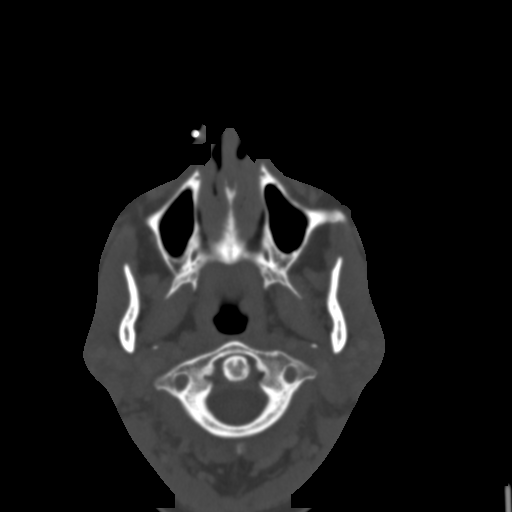
[im 54/87  bone]
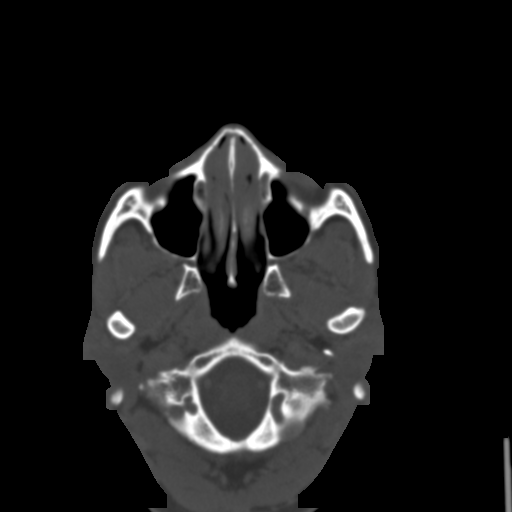
[im 63/87  bone]
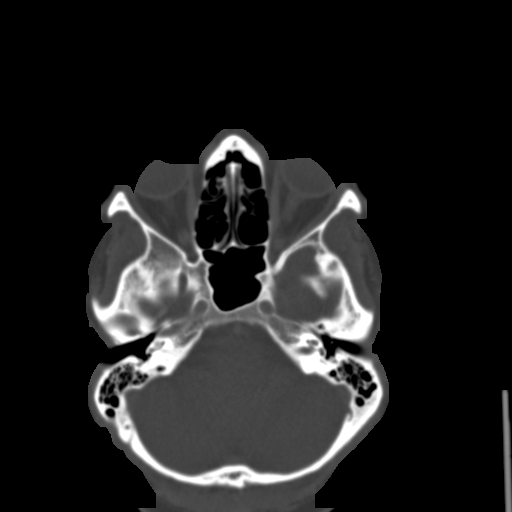
[im 72/87  bone]
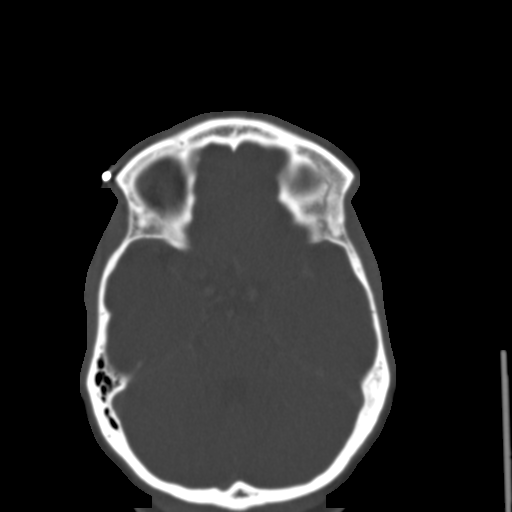
[im 81/87  brain]
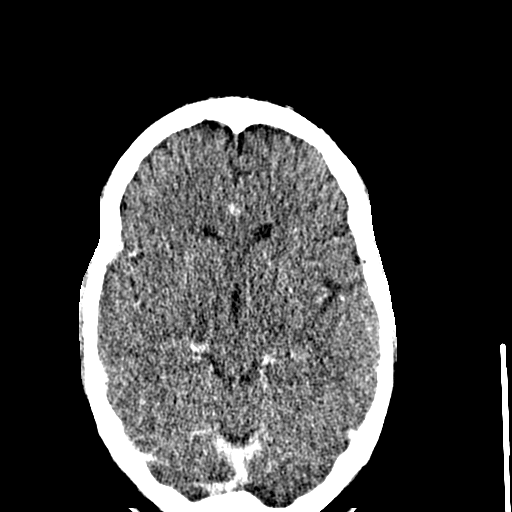
[im 81/87  bone]
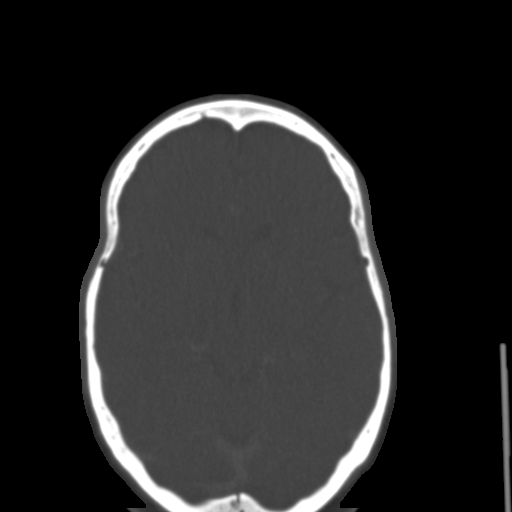

[Series 4: max st coro · coronal · 0.35mm/px · 3 of 88 slices shown]
[im 30/88  bone]
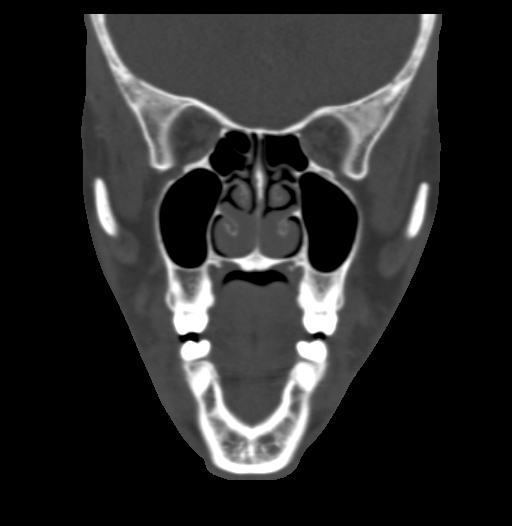
[im 39/88  bone]
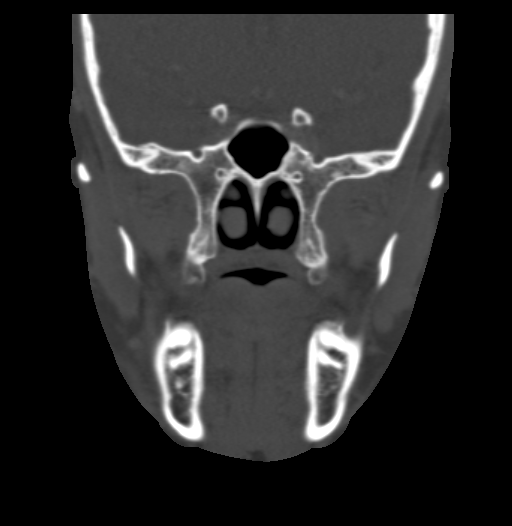
[im 49/88  bone]
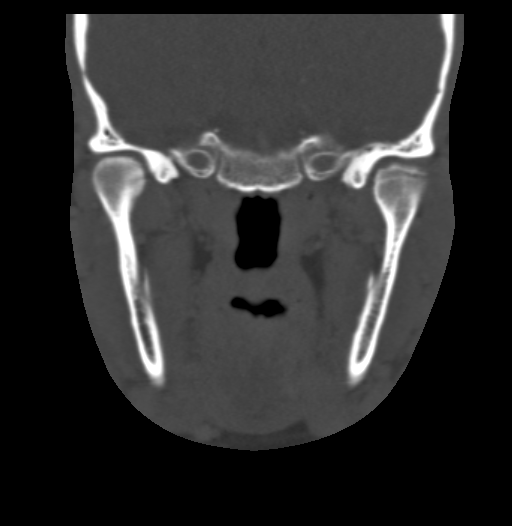

[Series 5: max st sag · sagittal · 0.34mm/px · 3 of 77 slices shown]
[im 26/77  bone]
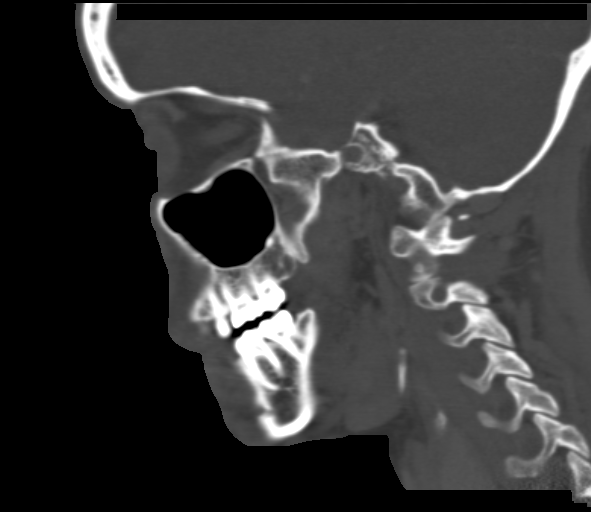
[im 39/77  bone]
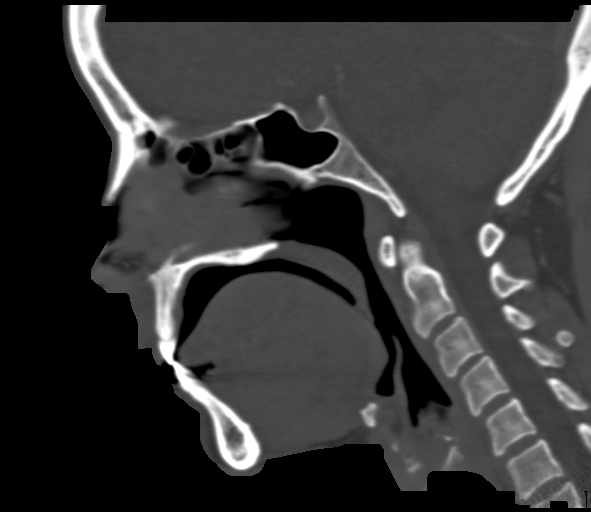
[im 51/77  bone]
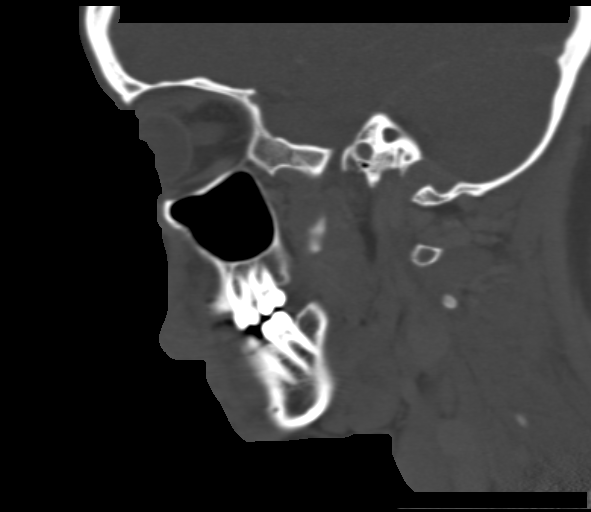

[15 of 47 positions shown; findings below may reference images not displayed]

FINDINGS: The location of the prior nasal bump is not well characterized on
CT. There is asymmetric soft tissue swelling along the right upper
lip. Mild diffuse soft tissue edema is noted along the philtrum. No
focal fluid collections are seen to suggest abscess. There is very
mild soft tissue swelling overlying the right lateral mandible. A
metallic piercing is noted at the right side of the nose. No
additional high-density foreign bodies are seen.

There is no evidence of fracture or dislocation. The maxilla and
mandible appear intact. The nasal bone is unremarkable in
appearance. The visualized dentition demonstrates no acute
abnormality.

The orbits are intact bilaterally. The visualized paranasal sinuses
and mastoid air cells are well-aerated.

No additional soft tissue abnormalities are seen. The parapharyngeal
fat planes are preserved. The nasopharynx, oropharynx and
hypopharynx are unremarkable in appearance. The visualized portions
of the valleculae and piriform sinuses are grossly unremarkable.

The parotid and submandibular glands are within normal limits. No
cervical lymphadenopathy is seen. The visualized portions of the
brain are unremarkable in appearance. The visualized intracranial
vasculature is grossly unremarkable.
IMPRESSION: 1. Asymmetric soft tissue swelling along the right upper lip, with
mild diffuse soft tissue edema along the philtrum, and very mild
soft tissue swelling overlying the right lateral mandible. No
evidence of abscess.
2. Otherwise unremarkable maxillofacial CT.

## 2016-09-18 ENCOUNTER — Encounter (HOSPITAL_COMMUNITY): Payer: Self-pay

## 2016-09-19 ENCOUNTER — Encounter: Payer: Self-pay | Admitting: *Deleted

## 2016-09-19 ENCOUNTER — Ambulatory Visit: Payer: Medicaid Other | Admitting: Advanced Practice Midwife

## 2016-09-20 ENCOUNTER — Ambulatory Visit: Payer: Medicaid Other | Admitting: Advanced Practice Midwife

## 2016-10-26 ENCOUNTER — Telehealth: Payer: Self-pay | Admitting: Women's Health

## 2016-10-26 NOTE — Telephone Encounter (Signed)
Patient states she needs a refill on her Prednisone since she is not able to get in with her [facility] until October. Has appointment on 9/6.

## 2016-10-26 NOTE — Telephone Encounter (Signed)
Per Dr Emelda Fear verbal order for Predisone 5mg -take 5mg  by mouth 2 times daily with a meal. Take 10mg  twice daily for 3 days, then resume 5mg  twice daily. Pt notified.

## 2016-11-08 ENCOUNTER — Ambulatory Visit: Payer: Medicaid Other | Admitting: Women's Health

## 2016-11-15 ENCOUNTER — Ambulatory Visit: Payer: Medicaid Other | Admitting: Women's Health

## 2016-11-16 ENCOUNTER — Ambulatory Visit: Payer: Medicaid Other | Admitting: Women's Health

## 2016-11-16 ENCOUNTER — Encounter: Payer: Self-pay | Admitting: *Deleted

## 2016-11-19 ENCOUNTER — Encounter: Payer: Self-pay | Admitting: Women's Health

## 2016-11-19 ENCOUNTER — Ambulatory Visit (INDEPENDENT_AMBULATORY_CARE_PROVIDER_SITE_OTHER): Payer: Medicaid Other | Admitting: Women's Health

## 2016-11-19 DIAGNOSIS — Z98891 History of uterine scar from previous surgery: Secondary | ICD-10-CM

## 2016-11-19 DIAGNOSIS — Z862 Personal history of diseases of the blood and blood-forming organs and certain disorders involving the immune mechanism: Secondary | ICD-10-CM

## 2016-11-19 DIAGNOSIS — Z8751 Personal history of pre-term labor: Secondary | ICD-10-CM | POA: Diagnosis not present

## 2016-11-19 DIAGNOSIS — D6862 Lupus anticoagulant syndrome: Secondary | ICD-10-CM

## 2016-11-19 LAB — POCT HEMOGLOBIN: HEMOGLOBIN: 10.8 g/dL — AB (ref 12.2–16.2)

## 2016-11-19 MED ORDER — PREDNISONE 5 MG PO TABS: 5.0000 mg | ORAL_TABLET | Freq: Two times a day (BID) | ORAL | 0 refills | Status: DC

## 2016-11-19 NOTE — Progress Notes (Signed)
[facility] Postpartum Visit  Patient name: Caroline Bishop MRN [id number]  Date of birth: 1975/06/11 CC & HPI:  Caroline Bishop is a 41 y.o. [id number] African American female being seen today for a postpartum visit. She is 3+ months postpartum following a repeat cesarean section, low transverse incision at 33.2 gestational weeks d/t PPROM/Breech, also had BTL. Anesthesia: spinal. I have fully reviewed the prenatal and intrapartum course. Pregnancy complicated by Lupus anticoagulant, was on prednisone  BID prior to pregnancy- continued throughout pregnancy- requesting refill today- [facility]. Postpartum course has been uncomplicated. Baby's course has been complicated by NICU stay. Baby is feeding by bottle. Bleeding no bleeding. Bowel function is normal. Bladder function is normal. Patient is sexually active. Contraception method is tubal ligation. Postpartum depression screening: negative. Score 8.  Last pap 08/02/16 and was neg w/ -HRHPV.  Review of Systems:   Denies Abnormal vaginal discharge w/ itching/odor/irritation, headaches, visual changes, shortness of breath, chest pain, abdominal pain, severe nausea/vomiting, or problems with urination or bowel movements.  Pertinent History Reviewed:  Reviewed past medical,surgical and family history.  Reviewed problem list, medications and allergies.  OB History  Gravida Para Term Preterm AB Living  SAB TAB Ectopic Multiple Live Births  1 1   0 4    # Outcome Date GA Lbr Len/2nd Weight Sex Delivery Anes PTL Lv  6 Preterm 08/07/16 [redacted]w[redacted]d  4 lb 0.9 oz (1.84 kg) M CS-LTranv Spinal  LIV  5 SAB 06/2013          4 Term 02/18/05 [redacted]w[redacted]d  7 lb 11 oz (3.487 kg) M Vag-Spont EPI N LIV     Birth Comments: gall bladder removed during pregnancy, fetal distres; born  [facility]  3 Preterm 03/30/98 [redacted]w[redacted]d  2 lb 6 oz (1.077 kg) F CS-Unspec  Y LIV  2 Term 07/18/92 [redacted]w[redacted]d  7 lb 13 oz (3.544 kg) F Vag-Spont  EPI N LIV     Birth Comments: no complications, born [place]  1 TAB 1991             Objective Findings:   Vitals:   11/19/16 1459  BP: 108/68  Pulse: 86  Weight: 164 lb (74.4 kg)  Height:  (1.676 m)    Body mass index is 26.47 kg/m. Patient's last menstrual period was 11/06/2016 (exact date).  General:  alert, cooperative and no distress   Breasts:  deferred, no complaints  Lungs: clear to auscultation bilaterally  Heart:  regular rate and rhythm  Abdomen: soft, nontender, c/s incision well-healed   Vulva: normal  Vagina: normal vagina  Cervix:  closed  Corpus: Well-involuted  Adnexa:  Non-palpable  Rectal Exam: No hemorrhoids        Results for orders placed or performed in visit on 11/19/16 (from the past 24 hour(s))  POCT hemoglobin   Collection Time: 11/19/16  3:08 PM  Result Value Ref Range   Hemoglobin 10.8 (A) 12.2 - 16.2 g/dL    Assessment & Plan:  1) Postpartum exam 2) 3 mths s/p RLTCS w/ BTL @ 33wks d/t PPROM/breech 3) Bottlefeeding 4) Depression screening 5) Lupus anticoagulant>refilled prednisone  BID x 30d>to get future refills from [place]- keep appt on 10/17 as scheduled  Return for after 5/31 for physical.   Orders Placed This Encounter  Procedures  . POCT hemoglobin    Marge Duncans CNM, [facility] 11/19/2016 3:32 PM

## 2017-06-24 ENCOUNTER — Other Ambulatory Visit: Payer: Self-pay

## 2017-06-24 ENCOUNTER — Emergency Department (HOSPITAL_COMMUNITY)
Admission: EM | Admit: 2017-06-24 | Discharge: 2017-06-24 | Disposition: A | Discharge status: Home / Self Care | Payer: Medicaid Other | Attending: Emergency Medicine | Admitting: Emergency Medicine

## 2017-06-24 ENCOUNTER — Encounter (HOSPITAL_COMMUNITY): Payer: Self-pay | Admitting: Emergency Medicine

## 2017-06-24 DIAGNOSIS — Y9389 Activity, other specified: Secondary | ICD-10-CM | POA: Insufficient documentation

## 2017-06-24 DIAGNOSIS — W268XXA Contact with other sharp object(s), not elsewhere classified, initial encounter: Secondary | ICD-10-CM | POA: Insufficient documentation

## 2017-06-24 DIAGNOSIS — Y999 Unspecified external cause status: Secondary | ICD-10-CM | POA: Insufficient documentation

## 2017-06-24 DIAGNOSIS — F17210 Nicotine dependence, cigarettes, uncomplicated: Secondary | ICD-10-CM | POA: Insufficient documentation

## 2017-06-24 DIAGNOSIS — S71111A Laceration without foreign body, right thigh, initial encounter: Secondary | ICD-10-CM

## 2017-06-24 DIAGNOSIS — Z79899 Other long term (current) drug therapy: Secondary | ICD-10-CM | POA: Insufficient documentation

## 2017-06-24 DIAGNOSIS — Y929 Unspecified place or not applicable: Secondary | ICD-10-CM | POA: Insufficient documentation

## 2017-06-24 MED ORDER — BACITRACIN ZINC 500 UNIT/GM EX OINT
TOPICAL_OINTMENT | CUTANEOUS | Status: AC
  Administered 2017-06-24: 1
  Filled 2017-06-24: qty 0.9

## 2017-06-24 NOTE — ED Notes (Signed)
Would cleaned with sponge. Rinsed, dried, applied bacitracin, and nonadhering bandage.

## 2017-06-24 NOTE — ED Provider Notes (Signed)
[facility] EMERGENCY DEPARTMENT Provider Note   CSN: [id number] Arrival date & time: 06/24/17  1740     History   Chief Complaint Chief Complaint  Patient presents with  . Laceration    HPI Caroline Bishop is a 42 y.o. female.  Patient attempting to traverse a fence on Friday, caught her leg on the metal upright of the fence, resulting in a wound to the back of the right thigh. She has been treating at home with daily cleaning and bandaging. She denies purulent drainage.  The history is provided by the patient. No language interpreter was used.  Laceration   The incident occurred more than 2 days ago. The laceration is located on the right leg. The laceration mechanism was a a metal edge. The pain is mild. The pain has been improving since onset. She reports no foreign bodies present. Her tetanus status is UTD.    Past Medical History:  Diagnosis Date  . Lupus anticoagulant complicating pregnancy in first trimester, antepartum [facility](HCC)     Patient Active Problem List   Diagnosis Date Noted  . Hyperglycemia 08/07/2016  . Drug abuse during pregnancy (HCC) 04/13/2016  . Smoker 04/12/2016  . History of preterm delivery 04/12/2016  . Depression 04/12/2016  . 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 Lupus anticoagulant positive 06/11/2013  . Previous cesarean section 06/11/2013    Past Surgical History:  Procedure Laterality Date  . CESAREAN SECTION    . CESAREAN SECTION WITH BILATERAL TUBAL LIGATION N/A 08/07/2016   Procedure: CESAREAN SECTION WITH BILATERAL TUBAL LIGATION;  Surgeon: Levie HeritageStinson, Jacob J, DO;  Location: [facility];  Service: Obstetrics;  Laterality: N/A;  . CHOLECYSTECTOMY       OB History    Gravida  6   Para  4   Term  2   Preterm  2   AB  2   Living  4     SAB  1   TAB  1   Ectopic      Multiple  0   Live Births  4            Home Medications    Prior to Admission medications   Medication Sig Start Date End  Date Taking? Authorizing Provider  ferrous sulfate 325 (65 FE) MG tablet Take 1 tablet (325 mg total) by mouth 2 (two) times daily with a meal. Patient not taking: Reported on 08/16/2016 04/23/16   Cheral MarkerBooker, Kimberly R, CNM  hydrochlorothiazide (HYDRODIURIL) 25 MG tablet Take 1 tablet (25 mg total) by mouth daily. Patient not taking: Reported on 11/19/2016 08/09/16   Reva BoresPratt, Tanya S, MD  ibuprofen (ADVIL,MOTRIN) 600 MG tablet Take 1 tablet (600 mg total) by mouth every 6 (six) hours. 08/09/16   Reva BoresPratt, Tanya S, MD  oxyCODONE (OXY IR/ROXICODONE) 5 MG immediate release tablet Take 1 tablet (5 mg total) by mouth every 4 (four) hours as needed (pain scale 4-7). Patient not taking: Reported on 11/19/2016 08/09/16   Reva BoresPratt, Tanya S, MD  predniSONE (DELTASONE) 5 MG tablet Take 1 tablet (5 mg total) by mouth 2 (two) times daily with a meal. 11/19/16   Cheral MarkerBooker, Kimberly R, CNM  Prenat w/o A-FeCbGl-DSS-FA-DHA [facility](CITRANATAL ASSURE) 35-1 & 300 MG tablet One tablet and one capsule daily 04/12/16   Cheral MarkerBooker, Kimberly R, CNM    Family History Family History  Problem Relation Age of Onset  . Lung cancer Mother   . Aneurysm Father  Brain  . Asthma Brother   . Diabetes Maternal Grandmother   . Hypertension Maternal Grandmother     Social History Social History   Tobacco Use  . Smoking status: Current Every Day Smoker    Packs/day: 0.00    Years: 20.00    Pack years: 0.00    Types: Cigarettes  . Smokeless tobacco: Never Used  . Tobacco comment: smokes a few a day  Substance Use Topics  . Alcohol use: Yes    Comment: occ  . Drug use: No     Allergies   Patient has no known allergies.   Review of Systems Review of Systems  Skin: Positive for wound.  All other systems reviewed and are negative.    Physical Exam Updated Vital Signs BP 117/90 (BP Location: Right Arm)   Pulse 91   Temp 98.5 F (36.9 C) (Oral)   Resp 18   Ht 5\' 6"  (1.676 m)   Wt 77.1 kg (170 lb)   LMP 06/17/2017 (Approximate)    SpO2 99%   BMI 27.44 kg/m   Physical Exam  Constitutional: She is oriented to person, place, and time. She appears well-developed and well-nourished.  HENT:  Head: Atraumatic.  Eyes: Conjunctivae are normal.  Neck: Neck supple.  Cardiovascular: Normal rate and regular rhythm.  Pulmonary/Chest: Effort normal and breath sounds normal.  Abdominal: Soft. She exhibits no distension. There is no tenderness.  Musculoskeletal: Normal range of motion.       Right upper leg: She exhibits laceration.       Legs: Neurological: She is alert and oriented to person, place, and time.  Skin: Skin is warm and dry.  Psychiatric: She has a normal mood and affect.  Nursing note and vitals reviewed.      ED Treatments / Results  Labs (all labs ordered are listed, but only abnormal results are displayed) Labs Reviewed - No data to display  EKG None  Radiology No results found.  Procedures Procedures (including critical care time)  Medications Ordered in ED Medications - No data to display   Initial Impression / Assessment and Plan / ED Course  I have reviewed the triage vital signs and the nursing notes.  Pertinent labs & imaging results that were available during my care of the patient were reviewed by me and considered in my medical decision making (see chart for details).     Patient with laceration to right thigh that occurred 5 days ago. Appears to be healing well via secondary intention without signs of infection. Wound cleaned in ED and dressed. Care instructions provided. Return precautions discussed. Patient appears safe for discharge at this time.  Final Clinical Impressions(s) / ED Diagnoses   Final diagnoses:  Laceration of right thigh, initial encounter    ED Discharge Orders    None       Felicie Morn, NP 06/24/17 2253    Paula Libra, MD 06/24/17 [phone]

## 2017-06-24 NOTE — ED Triage Notes (Signed)
Laceration to back of rt upper leg.  Happened Friday while climbing over a fence

## 2018-02-04 DIAGNOSIS — F4310 Post-traumatic stress disorder, unspecified: Secondary | ICD-10-CM | POA: Diagnosis not present

## 2018-02-05 DIAGNOSIS — F4310 Post-traumatic stress disorder, unspecified: Secondary | ICD-10-CM | POA: Diagnosis not present

## 2018-02-10 DIAGNOSIS — F4310 Post-traumatic stress disorder, unspecified: Secondary | ICD-10-CM | POA: Diagnosis not present

## 2018-02-17 DIAGNOSIS — F4310 Post-traumatic stress disorder, unspecified: Secondary | ICD-10-CM | POA: Diagnosis not present

## 2018-02-18 ENCOUNTER — Encounter (HOSPITAL_COMMUNITY): Payer: Self-pay | Admitting: Emergency Medicine

## 2018-02-18 ENCOUNTER — Emergency Department (HOSPITAL_COMMUNITY)
Admission: EM | Admit: 2018-02-18 | Discharge: 2018-02-18 | Disposition: A | Discharge status: Home / Self Care | Payer: Non-veteran care | Attending: Emergency Medicine | Admitting: Emergency Medicine

## 2018-02-18 ENCOUNTER — Other Ambulatory Visit: Payer: Self-pay

## 2018-02-18 DIAGNOSIS — M2550 Pain in unspecified joint: Secondary | ICD-10-CM

## 2018-02-18 DIAGNOSIS — M25552 Pain in left hip: Secondary | ICD-10-CM | POA: Diagnosis not present

## 2018-02-18 DIAGNOSIS — F17210 Nicotine dependence, cigarettes, uncomplicated: Secondary | ICD-10-CM | POA: Insufficient documentation

## 2018-02-18 DIAGNOSIS — M25561 Pain in right knee: Secondary | ICD-10-CM | POA: Diagnosis not present

## 2018-02-18 DIAGNOSIS — M25562 Pain in left knee: Secondary | ICD-10-CM | POA: Diagnosis not present

## 2018-02-18 DIAGNOSIS — Z79899 Other long term (current) drug therapy: Secondary | ICD-10-CM | POA: Insufficient documentation

## 2018-02-18 DIAGNOSIS — M25551 Pain in right hip: Secondary | ICD-10-CM | POA: Insufficient documentation

## 2018-02-18 LAB — PREGNANCY, URINE: PREG TEST UR: NEGATIVE

## 2018-02-18 MED ORDER — DEXAMETHASONE SODIUM PHOSPHATE 10 MG/ML IJ SOLN
10.0000 mg | Freq: Once | INTRAMUSCULAR | Status: AC
  Administered 2018-02-18: 10 mg via INTRAMUSCULAR
  Filled 2018-02-18: qty 1

## 2018-02-18 MED ORDER — PREDNISONE 5 MG PO TABS: 5.0000 mg | ORAL_TABLET | Freq: Two times a day (BID) | ORAL | 0 refills | Status: AC

## 2018-02-18 NOTE — Discharge Instructions (Signed)
Please see the information and instructions below regarding your visit.  Your diagnoses today include:  1. Polyarthralgia     Tests performed today include: See side panel of your discharge paperwork for testing performed today. Vital signs are listed at the bottom of these instructions.   Medications prescribed:    Take any prescribed medications only as prescribed, and any over the counter medications only as directed on the packaging.  Please resume your home prednisone prescription in 2 days.  Home care instructions:  Please follow any educational materials contained in this packet.   Follow-up instructions: Please follow-up with your primary care provider as soon as possible for further evaluation of your symptoms if they are not completely improved.   Return instructions:  Please return to the Emergency Department if you experience worsening symptoms.  Please return to the emergency department if you develop any worsening pain, increasing swelling or redness of joints, or fevers. Please return if you have any other emergent concerns.  Additional Information:   Your vital signs today were: LMP 01/03/2018  If your blood pressure (BP) was elevated on multiple readings during this visit above 130 for the top number or above 80 for the bottom number, please have this repeated by your primary care provider within one month. --------------  Thank you for allowing us to participate in your care today.

## 2018-02-18 NOTE — ED Provider Notes (Signed)
[facility] EMERGENCY DEPARTMENT Provider Note   CSN: [id number] Arrival date & time: 02/18/18  1300     History   Chief Complaint Chief Complaint  Patient presents with  . Generalized pain    HPI Caroline Bishop is a 42 y.o. female.  HPI  Patient is a 42 year old female with a history of lupus anticoagulant, inflammatory disease, treated on prednisone x4 years by rheumatology at [facility] presenting for medication refill.  She reports that she has not been able to get into her clinic appointment and her medication ran out 2 weeks ago.  She reports polyarthralgias in the joints of her MCPs, bilateral knees, hips, and back.  Patient denies any fevers or chills.  She reports that her right MCP joints have been swollen for "sometime", after she punched a wall, however she does not wish for x-ray today.  Patient reports that this is the distribution of her pain typically, and there is no difference today.    Past Medical History:  Diagnosis Date  . Lupus anticoagulant complicating pregnancy in first trimester, antepartum [facility])     Patient Active Problem List   Diagnosis Date Noted  . Hyperglycemia 08/07/2016  . Drug abuse during pregnancy (HCC) 04/13/2016  . Smoker 04/12/2016  . History of preterm delivery 04/12/2016  . Depression 04/12/2016  . 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 Lupus anticoagulant positive 06/11/2013  . Previous cesarean section 06/11/2013    Past Surgical History:  Procedure Laterality Date  . CESAREAN SECTION    . CESAREAN SECTION WITH BILATERAL TUBAL LIGATION N/A 08/07/2016   Procedure: CESAREAN SECTION WITH BILATERAL TUBAL LIGATION;  Surgeon: Levie Heritage, DO;  [facility];  Service: Obstetrics;  Laterality: N/A;  . CHOLECYSTECTOMY       OB History    Gravida  6   Para  4   Term  2   Preterm  2   AB  2   Living  4     SAB  1   TAB  1   Ectopic      Multiple  0   Live Births  4            Home  Medications    Prior to Admission medications   Medication Sig Start Date End Date Taking? Authorizing Provider  ferrous sulfate 325 (65 FE) MG tablet Take 1 tablet (325 mg total) by mouth 2 (two) times daily with a meal. Patient not taking: Reported on 08/16/2016 04/23/16   Cheral Marker, CNM  hydrochlorothiazide (HYDRODIURIL) 25 MG tablet Take 1 tablet (25 mg total) by mouth daily. Patient not taking: Reported on 11/19/2016 08/09/16   Reva Bores, MD  ibuprofen (ADVIL,MOTRIN) 600 MG tablet Take 1 tablet (600 mg total) by mouth every 6 (six) hours. 08/09/16   Reva Bores, MD  oxyCODONE (OXY IR/ROXICODONE) 5 MG immediate release tablet Take 1 tablet (5 mg total) by mouth every 4 (four) hours as needed (pain scale 4-7). Patient not taking: Reported on 11/19/2016 08/09/16   Reva Bores, MD  predniSONE (DELTASONE) 5 MG tablet Take 1 tablet (5 mg total) by mouth 2 (two) times daily with a meal. 11/19/16   Cheral Marker, CNM  Prenat w/o A-FeCbGl-DSS-FA-DHA [facility] ASSURE) 35-1 & 300 MG tablet One tablet and one capsule daily 04/12/16   Cheral Marker, CNM    Family History Family History  Problem Relation Age of Onset  . Lung cancer Mother   .  Aneurysm Father        Brain  . Asthma Brother   . Diabetes Maternal Grandmother   . Hypertension Maternal Grandmother     Social History Social History   Tobacco Use  . Smoking status: Current Every Day Smoker    Packs/day: 0.00    Years: 20.00    Pack years: 0.00    Types: Cigarettes  . Smokeless tobacco: Never Used  . Tobacco comment: smokes a few a day  Substance Use Topics  . Alcohol use: Yes    Comment: occ  . Drug use: No     Allergies   Patient has no known allergies.   Review of Systems Review of Systems  Constitutional: Negative for chills and fever.  Musculoskeletal: Positive for arthralgias and myalgias.  Skin: Negative for rash.  Neurological: Negative for weakness and numbness.     Physical  Exam Updated Vital Signs LMP 01/03/2018   Physical Exam Vitals signs and nursing note reviewed.  Constitutional:      General: She is not in acute distress.    Appearance: She is well-developed. She is not diaphoretic.     Comments: Sitting comfortably in bed.  HENT:     Head: Normocephalic and atraumatic.  Eyes:     General:        Right eye: No discharge.        Left eye: No discharge.     Conjunctiva/sclera: Conjunctivae normal.     Comments: EOMs normal to gross examination.  Neck:     Musculoskeletal: Normal range of motion.  Cardiovascular:     Rate and Rhythm: Normal rate and regular rhythm.     Comments: Intact, 2+ radial pulse. Abdominal:     General: There is no distension.  Musculoskeletal:        General: Swelling present.     Comments: Right MCP joints of right third, fourth, and fifth digit are edematous but not erythematous.  Patient has full range of motion of these joints. Bilateral knees with lateral appearance of effusion but no erythema.  Full range of motion flexion extension of bilateral knees.  No tenderness to palpation.  Skin:    General: Skin is warm and dry.  Neurological:     Mental Status: She is alert.     Comments: Cranial nerves intact to gross observation. Patient moves extremities without difficulty.  Psychiatric:        Behavior: Behavior normal.        Thought Content: Thought content normal.        Judgment: Judgment normal.      ED Treatments / Results  Labs (all labs ordered are listed, but only abnormal results are displayed) Labs Reviewed  PREGNANCY, URINE    EKG None  Radiology No results found.  Procedures Procedures (including critical care time)  Medications Ordered in ED Medications  dexamethasone (DECADRON) injection 10 mg (has no administration in time range)     Initial Impression / Assessment and Plan / ED Course  I have reviewed the triage vital signs and the nursing notes.  Pertinent labs & imaging  results that were available during my care of the patient were reviewed by me and considered in my medical decision making (see chart for details).     Patient is nontoxic-appearing, afebrile, and in no acute distress.  Patient presented for medication refill.  Patient appears to have injured her right hand quite some time ago punching a wall, however does not wish for  x-ray today.  She is in the process of re-obtaining primary care from her [place] provider.  She is previously followed by rheumatologist who prescribed prednisone 5 mg twice daily.  I discussed with the patient that we will continue this for 2 weeks, would like her to have follow-up at the end of that 2 weeks for reevaluation.  Recommended starting it 2 days after Decadron shot.  Recommended reevaluation for worsening pain, erythema edema of joints, or fever chills.  Patient is in understanding and agrees with the plan of care.  Final Clinical Impressions(s) / ED Diagnoses   Final diagnoses:  Polyarthralgia    ED Discharge Orders         Ordered    predniSONE (DELTASONE) 5 MG tablet  2 times daily with meals     02/18/18 1542           Elisha Ponder, PA-C 02/18/18 1543    Pricilla Loveless, MD 02/19/18 1623

## 2018-02-18 NOTE — ED Triage Notes (Signed)
[facility] and has not had her steroids. States she needs a pregnancy test and steroids until [facility]. Does not have an appointment made at this time. Pain has been going on for 10 years.

## 2018-03-03 DIAGNOSIS — F4310 Post-traumatic stress disorder, unspecified: Secondary | ICD-10-CM | POA: Diagnosis not present

## 2018-03-07 ENCOUNTER — Ambulatory Visit (HOSPITAL_COMMUNITY)
Admission: EM | Admit: 2018-03-07 | Discharge: 2018-03-07 | Disposition: A | Discharge status: Home / Self Care | Payer: Non-veteran care | Attending: Family Medicine | Admitting: Family Medicine

## 2018-03-07 ENCOUNTER — Encounter (HOSPITAL_COMMUNITY): Payer: Self-pay | Admitting: Emergency Medicine

## 2018-03-07 DIAGNOSIS — B070 Plantar wart: Secondary | ICD-10-CM

## 2018-03-07 LAB — POCT PREGNANCY, URINE: Preg Test, Ur: NEGATIVE

## 2018-03-07 MED ORDER — SALICYLIC ACID 40 % EX PADS: MEDICATED_PAD | CUTANEOUS | 0 refills | Status: AC

## 2018-03-07 NOTE — ED Provider Notes (Signed)
[facility]    CSN: [id number] Arrival date & time: 03/07/18  1517     History   Chief Complaint Chief Complaint  Patient presents with  . Toe Pain    HPI Caroline Bishop is a 43 y.o. female.   43 year old female comes in for pain/blister to the plantar surface of the right great toe for "months". States coming in as pain is not going away, and has a paler surrounding to the area. Denies fever, chills, night sweats. Denies erythema, warmth, swelling.  Patient would like to have a urine pregnancy test as her LMP was 01/05/2018.     Past Medical History:  Diagnosis Date  . Lupus anticoagulant complicating pregnancy in first trimester, antepartum [facility])     Patient Active Problem List   Diagnosis Date Noted  . Hyperglycemia 08/07/2016  . Drug abuse during pregnancy (HCC) 04/13/2016  . Smoker 04/12/2016  . History of preterm delivery 04/12/2016  . Depression 04/12/2016  . 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 Lupus anticoagulant positive 06/11/2013  . Previous cesarean section 06/11/2013    Past Surgical History:  Procedure Laterality Date  . CESAREAN SECTION    . CESAREAN SECTION WITH BILATERAL TUBAL LIGATION N/A 08/07/2016   Procedure: CESAREAN SECTION WITH BILATERAL TUBAL LIGATION;  Surgeon: Levie Heritage, DO;  Location: [facility];  Service: Obstetrics;  Laterality: N/A;  . CHOLECYSTECTOMY      OB History    Gravida  6   Para  4   Term  2   Preterm  2   AB  2   Living  4     SAB  1   TAB  1   Ectopic      Multiple  0   Live Births  4            Home Medications    Prior to Admission medications   Medication Sig Start Date End Date Taking? Authorizing Provider  ibuprofen (ADVIL,MOTRIN) 600 MG tablet Take 1 tablet (600 mg total) by mouth every 6 (six) hours. 08/09/16   Reva Bores, MD  Salicylic Acid 40 % PADS Apply directly over affected area, leave in place for 48 hours. 03/07/18   Belinda Fisher, PA-C     Family History Family History  Problem Relation Age of Onset  . Lung cancer Mother   . Aneurysm Father        Brain  . Asthma Brother   . Diabetes Maternal Grandmother   . Hypertension Maternal Grandmother     Social History Social History   Tobacco Use  . Smoking status: Current Every Day Smoker    Packs/day: 0.00    Years: 20.00    Pack years: 0.00    Types: Cigarettes  . Smokeless tobacco: Never Used  . Tobacco comment: smokes a few a day  Substance Use Topics  . Alcohol use: Yes    Comment: occ  . Drug use: No     Allergies   Patient has no known allergies.   Review of Systems Review of Systems  Reason unable to perform ROS: See HPI as above.     Physical Exam Triage Vital Signs ED Triage Vitals [03/07/18 1555]  Enc Vitals Group     BP (!) 137/100     Pulse Rate (!) 101     Resp (!) 22     Temp 98.7 F (37.1 C)     Temp Source Oral  SpO2 100 %     Weight      Height      Head Circumference      Peak Flow      Pain Score 10     Pain Loc      Pain Edu?      Excl. in GC?    No data found.  Updated Vital Signs BP (!) 137/100 (BP Location: Right Arm)   Pulse (!) 101   Temp 98.7 F (37.1 C) (Oral)   Resp (!) 22   LMP 01/05/2018   SpO2 100%   Physical Exam Constitutional:      General: She is not in acute distress.    Appearance: She is well-developed. She is not diaphoretic.  HENT:     Head: Normocephalic and atraumatic.  Eyes:     Conjunctiva/sclera: Conjunctivae normal.     Pupils: Pupils are equal, round, and reactive to light.  Musculoskeletal:     Comments: Wart to the plantar right great toe, about 0.5cm x 0.5cm. Pale surrounding about 1cm x 1cm, no obvious fluctuance, erythema, warmth. Full ROM of toe. Cap refill <2s  Skin:    General: Skin is warm and dry.  Neurological:     Mental Status: She is alert and oriented to person, place, and time.      UC Treatments / Results  Labs (all labs ordered are listed, but  only abnormal results are displayed) Labs Reviewed  POCT PREGNANCY, URINE    EKG None  Radiology No results found.  Procedures Procedures (including critical care time)  Medications Ordered in UC Medications - No data to display  Initial Impression / Assessment and Plan / UC Course  I have reviewed the triage vital signs and the nursing notes.  Pertinent labs & imaging results that were available during my care of the patient were reviewed by me and considered in my medical decision making (see chart for details).    Pregnancy test negative. Discussed exam consistent with wart. Discussed removal with dermatology. Will have patient try salicylic acid pads. Dressing discussed. Return precautions given.   Final Clinical Impressions(s) / UC Diagnoses   Final diagnoses:  Plantar wart    ED Prescriptions    Medication Sig Dispense Auth. Provider   Salicylic Acid 40 % PADS Apply directly over affected area, leave in place for 48 hours. 10 each Dorena Cookey, Amy V, PA-C 03/07/18 1644

## 2018-03-07 NOTE — Discharge Instructions (Signed)
Pregnancy test negative.  Start salicyclic acid pads as directed. Follow up with dermatology for removal.

## 2018-03-07 NOTE — ED Triage Notes (Addendum)
Pt presents to [facility] for assessment for toe blister/pain on right foot, great toe on pad.  Patient states she feels infection is growing in it now.  Pt states it has also been two months since her LMP, and she would like to do a test, unable to urinate at this time.

## 2018-03-10 DIAGNOSIS — F4310 Post-traumatic stress disorder, unspecified: Secondary | ICD-10-CM | POA: Diagnosis not present

## 2018-03-18 DIAGNOSIS — F4310 Post-traumatic stress disorder, unspecified: Secondary | ICD-10-CM | POA: Diagnosis not present

## 2018-03-24 DIAGNOSIS — F4310 Post-traumatic stress disorder, unspecified: Secondary | ICD-10-CM | POA: Diagnosis not present

## 2018-03-27 DIAGNOSIS — F4310 Post-traumatic stress disorder, unspecified: Secondary | ICD-10-CM | POA: Diagnosis not present

## 2018-04-03 DIAGNOSIS — F4310 Post-traumatic stress disorder, unspecified: Secondary | ICD-10-CM | POA: Diagnosis not present

## 2018-04-10 DIAGNOSIS — F4310 Post-traumatic stress disorder, unspecified: Secondary | ICD-10-CM | POA: Diagnosis not present

## 2018-04-15 DIAGNOSIS — F4310 Post-traumatic stress disorder, unspecified: Secondary | ICD-10-CM | POA: Diagnosis not present

## 2018-04-22 DIAGNOSIS — F4310 Post-traumatic stress disorder, unspecified: Secondary | ICD-10-CM | POA: Diagnosis not present

## 2018-04-29 DIAGNOSIS — F4310 Post-traumatic stress disorder, unspecified: Secondary | ICD-10-CM | POA: Diagnosis not present

## 2018-05-06 DIAGNOSIS — F4310 Post-traumatic stress disorder, unspecified: Secondary | ICD-10-CM | POA: Diagnosis not present

## 2018-05-13 DIAGNOSIS — F4310 Post-traumatic stress disorder, unspecified: Secondary | ICD-10-CM | POA: Diagnosis not present

## 2018-05-20 DIAGNOSIS — F4310 Post-traumatic stress disorder, unspecified: Secondary | ICD-10-CM | POA: Diagnosis not present

## 2018-05-27 DIAGNOSIS — F4310 Post-traumatic stress disorder, unspecified: Secondary | ICD-10-CM | POA: Diagnosis not present

## 2018-06-03 DIAGNOSIS — F4310 Post-traumatic stress disorder, unspecified: Secondary | ICD-10-CM | POA: Diagnosis not present

## 2018-06-10 DIAGNOSIS — F4310 Post-traumatic stress disorder, unspecified: Secondary | ICD-10-CM | POA: Diagnosis not present

## 2018-06-16 DIAGNOSIS — F4310 Post-traumatic stress disorder, unspecified: Secondary | ICD-10-CM | POA: Diagnosis not present

## 2018-06-24 DIAGNOSIS — F4310 Post-traumatic stress disorder, unspecified: Secondary | ICD-10-CM | POA: Diagnosis not present

## 2018-06-30 DIAGNOSIS — F4310 Post-traumatic stress disorder, unspecified: Secondary | ICD-10-CM | POA: Diagnosis not present

## 2018-07-08 DIAGNOSIS — F4310 Post-traumatic stress disorder, unspecified: Secondary | ICD-10-CM | POA: Diagnosis not present

## 2018-07-15 DIAGNOSIS — F4310 Post-traumatic stress disorder, unspecified: Secondary | ICD-10-CM | POA: Diagnosis not present

## 2018-07-22 DIAGNOSIS — F4310 Post-traumatic stress disorder, unspecified: Secondary | ICD-10-CM | POA: Diagnosis not present

## 2018-07-28 DIAGNOSIS — F4310 Post-traumatic stress disorder, unspecified: Secondary | ICD-10-CM | POA: Diagnosis not present

## 2020-08-10 ENCOUNTER — Emergency Department (HOSPITAL_COMMUNITY): Payer: No Typology Code available for payment source

## 2020-08-10 ENCOUNTER — Encounter (HOSPITAL_COMMUNITY): Payer: Self-pay

## 2020-08-10 ENCOUNTER — Other Ambulatory Visit: Payer: Self-pay

## 2020-08-10 ENCOUNTER — Emergency Department (HOSPITAL_COMMUNITY)
Admission: EM | Admit: 2020-08-10 | Discharge: 2020-08-10 | Discharge status: Against Medical Advice | Payer: No Typology Code available for payment source | Attending: Emergency Medicine | Admitting: Emergency Medicine

## 2020-08-10 DIAGNOSIS — Z5321 Procedure and treatment not carried out due to patient leaving prior to being seen by health care provider: Secondary | ICD-10-CM | POA: Insufficient documentation

## 2020-08-10 DIAGNOSIS — H5712 Ocular pain, left eye: Secondary | ICD-10-CM | POA: Diagnosis not present

## 2020-08-10 MED ORDER — TETRACAINE HCL 0.5 % OP SOLN: 2.0000 [drp] | Freq: Once | OPHTHALMIC | Status: DC

## 2020-08-10 MED ORDER — FLUORESCEIN SODIUM 1 MG OP STRP: 1.0000 | ORAL_STRIP | Freq: Once | OPHTHALMIC | Status: DC

## 2020-08-10 NOTE — ED Notes (Signed)
Registration handed this NT the armband of this patient and stated this patient left
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
## 2021-02-02 DIAGNOSIS — M79603 Pain in arm, unspecified: Secondary | ICD-10-CM | POA: Diagnosis not present

## 2021-02-02 DIAGNOSIS — R000 Tachycardia, unspecified: Secondary | ICD-10-CM | POA: Diagnosis not present

## 2022-06-27 ENCOUNTER — Emergency Department (HOSPITAL_COMMUNITY): Payer: Medicaid Other

## 2022-06-27 ENCOUNTER — Encounter (HOSPITAL_COMMUNITY): Payer: Self-pay | Admitting: Emergency Medicine

## 2022-06-27 ENCOUNTER — Other Ambulatory Visit: Payer: Self-pay

## 2022-06-27 ENCOUNTER — Emergency Department (HOSPITAL_COMMUNITY)
Admission: EM | Admit: 2022-06-27 | Discharge: 2022-06-28 | Payer: Medicaid Other | Attending: Emergency Medicine | Admitting: Emergency Medicine

## 2022-06-27 DIAGNOSIS — M25562 Pain in left knee: Secondary | ICD-10-CM | POA: Insufficient documentation

## 2022-06-27 DIAGNOSIS — F17210 Nicotine dependence, cigarettes, uncomplicated: Secondary | ICD-10-CM | POA: Diagnosis not present

## 2022-06-27 DIAGNOSIS — Y92410 Unspecified street and highway as the place of occurrence of the external cause: Secondary | ICD-10-CM | POA: Diagnosis not present
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
## 2022-06-28 ENCOUNTER — Encounter (HOSPITAL_COMMUNITY): Payer: Self-pay | Admitting: Emergency Medicine

## 2022-06-28 ENCOUNTER — Emergency Department (HOSPITAL_COMMUNITY): Payer: Medicaid Other

## 2022-06-28 ENCOUNTER — Encounter (HOSPITAL_COMMUNITY): Payer: Self-pay

## 2022-06-28 LAB — CBC
HCT: 33.8 % — ABNORMAL LOW (ref 36.0–46.0)
Hemoglobin: 10.6 g/dL — ABNORMAL LOW (ref 12.0–15.0)
MCH: 25.9 pg — ABNORMAL LOW (ref 26.0–34.0)
MCHC: 31.4 g/dL (ref 30.0–36.0)
MCV: 82.6 fL (ref 80.0–100.0)
Platelets: 352 10*3/uL (ref 150–400)
RBC: 4.09 MIL/uL (ref 3.87–5.11)
RDW: 17.2 % — ABNORMAL HIGH (ref 11.5–15.5)
WBC: 11.8 10*3/uL — ABNORMAL HIGH (ref 4.0–10.5)
nRBC: 0 % (ref 0.0–0.2)

## 2022-06-28 LAB — URINALYSIS, ROUTINE W REFLEX MICROSCOPIC
Bilirubin Urine: NEGATIVE
Glucose, UA: NEGATIVE mg/dL
Ketones, ur: NEGATIVE mg/dL
Nitrite: NEGATIVE
Protein, ur: 30 mg/dL — AB
Specific Gravity, Urine: >1.046 — ABNORMAL HIGH (ref 1.005–1.030)
pH: 5 (ref 5.0–8.0)

## 2022-06-28 LAB — COMPREHENSIVE METABOLIC PANEL
ALT: 14 U/L (ref 0–44)
AST: 24 U/L (ref 15–41)
Albumin: 3.6 g/dL (ref 3.5–5.0)
Alkaline Phosphatase: 70 U/L (ref 38–126)
Anion gap: 7 (ref 5–15)
BUN: 15 mg/dL (ref 6–20)
CO2: 27 mmol/L (ref 22–32)
Calcium: 8.9 mg/dL (ref 8.9–10.3)
Chloride: 107 mmol/L (ref 98–111)
Creatinine, Ser: 0.81 mg/dL (ref 0.44–1.00)
GFR, Estimated: >60 mL/min (ref >60)
Glucose, Bld: 77 mg/dL (ref 70–99)
Potassium: 3.2 mmol/L — ABNORMAL LOW (ref 3.5–5.1)
Sodium: 141 mmol/L (ref 135–145)
Total Bilirubin: 0.6 mg/dL (ref 0.3–1.2)
Total Protein: 8.3 g/dL — ABNORMAL HIGH (ref 6.5–8.1)

## 2022-06-28 LAB — I-STAT BETA HCG BLOOD, ED (MC, WL, AP ONLY): I-stat hCG, quantitative: <5 m[IU]/mL (ref <5)

## 2022-06-28 LAB — SAMPLE TO BLOOD BANK

## 2022-06-28 LAB — LACTIC ACID, PLASMA: Lactic Acid, Venous: 1.3 mmol/L (ref 0.5–1.9)

## 2022-06-28 LAB — HCG, SERUM, QUALITATIVE: Preg, Serum: NEGATIVE

## 2022-06-28 LAB — PROTIME-INR
INR: 1.1 (ref 0.8–1.2)
Prothrombin Time: 14.1 seconds (ref 11.4–15.2)

## 2022-06-28 LAB — HIV ANTIBODY (ROUTINE TESTING W REFLEX): HIV Screen 4th Generation wRfx: NONREACTIVE

## 2022-06-28 LAB — ETHANOL: Alcohol, Ethyl (B): <10 mg/dL (ref <10)

## 2022-06-28 MED ORDER — IOHEXOL 300 MG/ML  SOLN
100.0000 mL | Freq: Once | INTRAMUSCULAR | Status: AC | PRN
  Administered 2022-06-28: 100 mL via INTRAVENOUS
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
## 2022-06-28 NOTE — ED Notes (Signed)
Patient transported to CT 
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
